# Patient Record
Sex: Female | Born: 1975
Health system: Southern US, Community
[De-identification: ages and names within clinical notes are randomized; demographics above are authoritative.]

## PROBLEM LIST (undated history)

## (undated) DIAGNOSIS — Z8719 Personal history of other diseases of the digestive system: Secondary | ICD-10-CM

## (undated) DIAGNOSIS — Z972 Presence of dental prosthetic device (complete) (partial): Secondary | ICD-10-CM

## (undated) DIAGNOSIS — D759 Disease of blood and blood-forming organs, unspecified: Secondary | ICD-10-CM

## (undated) DIAGNOSIS — Z8711 Personal history of peptic ulcer disease: Secondary | ICD-10-CM

## (undated) DIAGNOSIS — Z9289 Personal history of other medical treatment: Secondary | ICD-10-CM

## (undated) DIAGNOSIS — Z87442 Personal history of urinary calculi: Secondary | ICD-10-CM

## (undated) DIAGNOSIS — K219 Gastro-esophageal reflux disease without esophagitis: Secondary | ICD-10-CM

## (undated) DIAGNOSIS — D649 Anemia, unspecified: Secondary | ICD-10-CM

## (undated) DIAGNOSIS — N938 Other specified abnormal uterine and vaginal bleeding: Secondary | ICD-10-CM

## (undated) HISTORY — DX: Personal history of peptic ulcer disease: Z87.11

## (undated) HISTORY — PX: TUBAL LIGATION: SHX77

## (undated) HISTORY — DX: Personal history of other diseases of the digestive system: Z87.19

---

## 2015-03-06 ENCOUNTER — Emergency Department (HOSPITAL_COMMUNITY): Payer: Medicaid Other

## 2015-03-06 ENCOUNTER — Emergency Department (HOSPITAL_COMMUNITY)
Admission: EM | Admit: 2015-03-06 | Discharge: 2015-03-06 | Disposition: A | Discharge status: Home / Self Care | Payer: Medicaid Other | Attending: Physician Assistant | Admitting: Physician Assistant

## 2015-03-06 ENCOUNTER — Encounter (HOSPITAL_COMMUNITY): Payer: Self-pay | Admitting: Emergency Medicine

## 2015-03-06 DIAGNOSIS — Y9301 Activity, walking, marching and hiking: Secondary | ICD-10-CM | POA: Diagnosis not present

## 2015-03-06 DIAGNOSIS — S8992XA Unspecified injury of left lower leg, initial encounter: Secondary | ICD-10-CM | POA: Diagnosis present

## 2015-03-06 DIAGNOSIS — M25562 Pain in left knee: Secondary | ICD-10-CM

## 2015-03-06 DIAGNOSIS — Z23 Encounter for immunization: Secondary | ICD-10-CM | POA: Insufficient documentation

## 2015-03-06 DIAGNOSIS — S8002XA Contusion of left knee, initial encounter: Secondary | ICD-10-CM | POA: Diagnosis not present

## 2015-03-06 DIAGNOSIS — Y9289 Other specified places as the place of occurrence of the external cause: Secondary | ICD-10-CM | POA: Insufficient documentation

## 2015-03-06 DIAGNOSIS — Y998 Other external cause status: Secondary | ICD-10-CM | POA: Insufficient documentation

## 2015-03-06 DIAGNOSIS — F17210 Nicotine dependence, cigarettes, uncomplicated: Secondary | ICD-10-CM | POA: Diagnosis not present

## 2015-03-06 DIAGNOSIS — W01198A Fall on same level from slipping, tripping and stumbling with subsequent striking against other object, initial encounter: Secondary | ICD-10-CM | POA: Diagnosis not present

## 2015-03-06 DIAGNOSIS — S80212A Abrasion, left knee, initial encounter: Secondary | ICD-10-CM | POA: Insufficient documentation

## 2015-03-06 DIAGNOSIS — T148XXA Other injury of unspecified body region, initial encounter: Secondary | ICD-10-CM

## 2015-03-06 MED ORDER — IBUPROFEN 800 MG PO TABS: 800.0000 mg | ORAL_TABLET | Freq: Three times a day (TID) | ORAL | Status: DC

## 2015-03-06 MED ORDER — TETANUS-DIPHTH-ACELL PERTUSSIS 5-2.5-18.5 LF-MCG/0.5 IM SUSP
0.5000 mL | Freq: Once | INTRAMUSCULAR | Status: AC
  Administered 2015-03-06: 0.5 mL via INTRAMUSCULAR
  Filled 2015-03-06: qty 0.5

## 2015-03-06 MED ORDER — IBUPROFEN 400 MG PO TABS
800.0000 mg | ORAL_TABLET | Freq: Once | ORAL | Status: AC
  Administered 2015-03-06: 800 mg via ORAL
  Filled 2015-03-06: qty 2

## 2015-03-06 NOTE — ED Notes (Signed)
Pt reports that she had a fall 2 days ago where she tripped and fell striking her knee and elbow. Pt reports elbow is fine but knee feels like its getting worse. Pt alert x4.

## 2015-03-06 NOTE — ED Provider Notes (Signed)
CSN: [id number]     Arrival date & time 03/06/15  1038 History  By signing my name below, I, Emmanuella Mensah, attest that this documentation has been prepared under the direction and in the presence of  Cheri Fowler, PA-C. Electronically Signed: Angelene Giovanni, ED Scribe. 03/06/2015. 1:15 PM.    Chief Complaint  Patient presents with  . Knee Pain   The history is provided by the patient. No language interpreter was used.   HPI Comments: Sheila Bell is a 40 y.o. female who presents to the Emergency Department complaining of gradually worsening non-radiating moderate left knee pain  s/p fall that occurred 2 days ago. She reports associated swelling to the left knee. She explains that she was walking outside when she tripped and fell on her knees. Pt is unsure if her tetanus vaccine is UTD. She denies any numbness, tingling, or pain anywhere else.   History reviewed. No pertinent past medical history. Past Surgical History  Procedure Laterality Date  . Tubal ligation     No family history on file. Social History  Substance Use Topics  . Smoking status: Current Every Day Smoker -- 1.00 packs/day for 15 years    Types: Cigarettes  . Smokeless tobacco: None  . Alcohol Use: No   OB History    No data available     Review of Systems  All other systems reviewed and are negative.     Allergies  Review of patient's allergies indicates no known allergies.  Home Medications   Prior to Admission medications   Not on File   BP 112/56 mmHg  Pulse 86  Temp(Src) 98 F (36.7 C) (Oral)  Resp 18  SpO2 100%  LMP 02/20/2015 (Exact Date) Physical Exam  Constitutional: She is oriented to person, place, and time. She appears well-developed and well-nourished.  HENT:  Head: Normocephalic and atraumatic.  Right Ear: External ear normal.  Left Ear: External ear normal.  Eyes: Conjunctivae are normal. No scleral icterus.  Neck: No tracheal deviation present.  Cardiovascular:  Normal rate, regular rhythm, normal heart sounds and intact distal pulses.   Pulses:      Dorsalis pedis pulses are 2+ on the right side, and 2+ on the left side.  Capillary refill less than 3 seconds.   Pulmonary/Chest: Effort normal and breath sounds normal. No respiratory distress.  Abdominal: Soft. Bowel sounds are normal. She exhibits no distension. There is no tenderness.  Musculoskeletal: Normal range of motion. She exhibits tenderness.  No obvious deformity, ecchymosis.  Well appearing abrasion to left anterior knee.  No effusion or edema. TTP along patella, tibial plateau, and joint line.  No medial or lateral joint tenderness.  No quadricepts tendon tenderness.  Patella stable, normal patella mobility.  Compartment is soft and compressible.  Full ROM (flexion/extesion).   -Negative anterior/posterior drawer.   -Negative varus/valgus test.     Neurological: She is alert and oriented to person, place, and time.  Strength and sensation intact bilaterally throughout lower extremities.  Gait normal.   Skin: Skin is warm and dry.  Healing Abrasion on anterior knee without signs of infection.  Psychiatric: She has a normal mood and affect. Her behavior is normal.    ED Course  Procedures (including critical care time) DIAGNOSTIC STUDIES: Oxygen Saturation is 100% on RA, normal by my interpretation.    COORDINATION OF CARE: 1:04 PM- Pt advised of plan for treatment and pt agrees. Pt will receive 800 mg Ibuprofen for 3 times a day  and a knee sleeve. Advised to use ice each hour. Return precautions discussed, pt will return if she experiences numbness. WIll provide resources for follow up with Ortho.   Labs Review Labs Reviewed - No data to display  Imaging Review Dg Knee Complete 4 Views Left  03/06/2015  CLINICAL DATA:  Injury, fall 3 days ago on left knee. Unable to bear weight today. EXAM: LEFT KNEE - COMPLETE 4+ VIEW COMPARISON:  None. FINDINGS: There is no evidence of fracture,  dislocation, or joint effusion. There is no evidence of arthropathy or other focal bone abnormality. Soft tissues are unremarkable. IMPRESSION: Negative. Electronically Signed   By: Charlett Nose M.D.   On: 03/06/2015 11:48    Cheri Fowler, PA-C has personally reviewed and evaluated these images and lab results as part of her medical decision-making.   EKG Interpretation None      MDM   Final diagnoses:  Left knee pain  Knee contusion, left, initial encounter  Abrasion    VSS, NAD.  On exam, compartments soft and compressible. Generalized knee tenderness. Full active range of motion. Patient ambulatory without difficulty. Neurovascularly intact distal to injury. Abrasion noted on anterior aspect of knee without signs of infection. Patient given ibuprofen, and Tdap updated in ED. Plain films negative for acute abnormalities. Patient given knee sleeve. Follow-up with orthopedics for persistent symptoms. Discussed return precautions. Patient agrees an Field seismologist the above plan for discharge.  I personally performed the services described in this documentation, which was scribed in my presence. The recorded information has been reviewed and is accurate.   Cheri Fowler, PA-C 03/06/15 1317  Courteney Randall An, MD 03/06/15 1615

## 2015-03-06 NOTE — Discharge Instructions (Signed)

## 2015-03-06 NOTE — ED Notes (Signed)
Declined W/C at D/C and was escorted to lobby by RN. 

## 2015-03-06 NOTE — ED Notes (Signed)
Patient called in main ED waiting area with no response 

## 2017-07-05 DIAGNOSIS — Z9289 Personal history of other medical treatment: Secondary | ICD-10-CM

## 2017-07-05 HISTORY — DX: Personal history of other medical treatment: Z92.89

## 2017-07-21 ENCOUNTER — Inpatient Hospital Stay (HOSPITAL_COMMUNITY)
Admission: EM | Admit: 2017-07-21 | Discharge: 2017-07-23 | DRG: 812 | Disposition: A | Discharge status: Home / Self Care | Payer: Self-pay | Attending: Internal Medicine | Admitting: Internal Medicine

## 2017-07-21 ENCOUNTER — Emergency Department (HOSPITAL_COMMUNITY): Payer: Self-pay

## 2017-07-21 ENCOUNTER — Encounter (HOSPITAL_COMMUNITY): Payer: Self-pay

## 2017-07-21 DIAGNOSIS — N939 Abnormal uterine and vaginal bleeding, unspecified: Secondary | ICD-10-CM

## 2017-07-21 DIAGNOSIS — E349 Endocrine disorder, unspecified: Secondary | ICD-10-CM | POA: Diagnosis present

## 2017-07-21 DIAGNOSIS — N920 Excessive and frequent menstruation with regular cycle: Secondary | ICD-10-CM | POA: Diagnosis present

## 2017-07-21 DIAGNOSIS — R7989 Other specified abnormal findings of blood chemistry: Secondary | ICD-10-CM | POA: Diagnosis present

## 2017-07-21 DIAGNOSIS — N289 Disorder of kidney and ureter, unspecified: Secondary | ICD-10-CM

## 2017-07-21 DIAGNOSIS — B9620 Unspecified Escherichia coli [E. coli] as the cause of diseases classified elsewhere: Secondary | ICD-10-CM | POA: Diagnosis present

## 2017-07-21 DIAGNOSIS — E876 Hypokalemia: Secondary | ICD-10-CM | POA: Diagnosis present

## 2017-07-21 DIAGNOSIS — R1011 Right upper quadrant pain: Secondary | ICD-10-CM | POA: Diagnosis present

## 2017-07-21 DIAGNOSIS — Z6823 Body mass index (BMI) 23.0-23.9, adult: Secondary | ICD-10-CM

## 2017-07-21 DIAGNOSIS — D649 Anemia, unspecified: Secondary | ICD-10-CM | POA: Diagnosis present

## 2017-07-21 DIAGNOSIS — N390 Urinary tract infection, site not specified: Secondary | ICD-10-CM | POA: Diagnosis present

## 2017-07-21 DIAGNOSIS — I951 Orthostatic hypotension: Secondary | ICD-10-CM | POA: Diagnosis present

## 2017-07-21 DIAGNOSIS — F1410 Cocaine abuse, uncomplicated: Secondary | ICD-10-CM | POA: Diagnosis present

## 2017-07-21 DIAGNOSIS — Z9851 Tubal ligation status: Secondary | ICD-10-CM

## 2017-07-21 DIAGNOSIS — N179 Acute kidney failure, unspecified: Secondary | ICD-10-CM | POA: Diagnosis present

## 2017-07-21 DIAGNOSIS — E46 Unspecified protein-calorie malnutrition: Secondary | ICD-10-CM | POA: Diagnosis present

## 2017-07-21 DIAGNOSIS — I959 Hypotension, unspecified: Secondary | ICD-10-CM | POA: Diagnosis present

## 2017-07-21 DIAGNOSIS — E871 Hypo-osmolality and hyponatremia: Secondary | ICD-10-CM | POA: Diagnosis present

## 2017-07-21 DIAGNOSIS — R6510 Systemic inflammatory response syndrome (SIRS) of non-infectious origin without acute organ dysfunction: Secondary | ICD-10-CM | POA: Diagnosis present

## 2017-07-21 DIAGNOSIS — N760 Acute vaginitis: Secondary | ICD-10-CM | POA: Diagnosis present

## 2017-07-21 DIAGNOSIS — F17210 Nicotine dependence, cigarettes, uncomplicated: Secondary | ICD-10-CM | POA: Diagnosis present

## 2017-07-21 DIAGNOSIS — D500 Iron deficiency anemia secondary to blood loss (chronic): Principal | ICD-10-CM | POA: Diagnosis present

## 2017-07-21 HISTORY — DX: Anemia, unspecified: D64.9

## 2017-07-21 LAB — HCG, QUANTITATIVE, PREGNANCY: HCG, BETA CHAIN, QUANT, S: 2 m[IU]/mL (ref <5)

## 2017-07-21 LAB — HEPATIC FUNCTION PANEL
ALK PHOS: 129 U/L — AB (ref 38–126)
ALT: 35 U/L (ref 0–44)
AST: 46 U/L — ABNORMAL HIGH (ref 15–41)
Albumin: 2.4 g/dL — ABNORMAL LOW (ref 3.5–5.0)
BILIRUBIN INDIRECT: 0.5 mg/dL (ref 0.3–0.9)
Bilirubin, Direct: 0.2 mg/dL (ref 0.0–0.2)
TOTAL PROTEIN: 5.5 g/dL — AB (ref 6.5–8.1)
Total Bilirubin: 0.7 mg/dL (ref 0.3–1.2)

## 2017-07-21 LAB — BASIC METABOLIC PANEL
ANION GAP: 10 (ref 5–15)
BUN: 28 mg/dL — ABNORMAL HIGH (ref 6–20)
CALCIUM: 8.5 mg/dL — AB (ref 8.9–10.3)
CO2: 25 mmol/L (ref 22–32)
Chloride: 97 mmol/L — ABNORMAL LOW (ref 98–111)
Creatinine, Ser: 1.57 mg/dL — ABNORMAL HIGH (ref 0.44–1.00)
GFR, EST AFRICAN AMERICAN: 46 mL/min — AB (ref >60)
GFR, EST NON AFRICAN AMERICAN: 39 mL/min — AB (ref >60)
Glucose, Bld: 114 mg/dL — ABNORMAL HIGH (ref 70–99)
POTASSIUM: 2.7 mmol/L — AB (ref 3.5–5.1)
Sodium: 132 mmol/L — ABNORMAL LOW (ref 135–145)

## 2017-07-21 LAB — CBC
HCT: 22.9 % — ABNORMAL LOW (ref 36.0–46.0)
Hemoglobin: 6.9 g/dL — CL (ref 12.0–15.0)
MCH: 20.9 pg — ABNORMAL LOW (ref 26.0–34.0)
MCHC: 30.1 g/dL (ref 30.0–36.0)
MCV: 69.4 fL — ABNORMAL LOW (ref 78.0–100.0)
PLATELETS: 187 10*3/uL (ref 150–400)
RBC: 3.3 MIL/uL — AB (ref 3.87–5.11)
RDW: 17.2 % — AB (ref 11.5–15.5)
WBC: 11.2 10*3/uL — AB (ref 4.0–10.5)

## 2017-07-21 LAB — PREPARE RBC (CROSSMATCH)

## 2017-07-21 LAB — I-STAT BETA HCG BLOOD, ED (MC, WL, AP ONLY): I-stat hCG, quantitative: 23.6 m[IU]/mL — ABNORMAL HIGH (ref <5)

## 2017-07-21 LAB — RETICULOCYTES
RBC.: 3.05 MIL/uL — ABNORMAL LOW (ref 3.87–5.11)
Retic Ct Pct: <0.4 % — ABNORMAL LOW (ref 0.4–3.1)

## 2017-07-21 LAB — PROTIME-INR
INR: 1.19
Prothrombin Time: 15 seconds (ref 11.4–15.2)

## 2017-07-21 LAB — I-STAT TROPONIN, ED: Troponin i, poc: 0 ng/mL (ref 0.00–0.08)

## 2017-07-21 LAB — ABO/RH: ABO/RH(D): A POS

## 2017-07-21 MED ORDER — POTASSIUM CHLORIDE CRYS ER 20 MEQ PO TBCR
40.0000 meq | EXTENDED_RELEASE_TABLET | Freq: Once | ORAL | Status: AC
  Administered 2017-07-21: 40 meq via ORAL
  Filled 2017-07-21: qty 2

## 2017-07-21 MED ORDER — POTASSIUM CHLORIDE 10 MEQ/100ML IV SOLN
10.0000 meq | INTRAVENOUS | Status: AC
Start: 2017-07-21 — End: 2017-07-22
  Administered 2017-07-21 – 2017-07-22 (×3): 10 meq via INTRAVENOUS
  Filled 2017-07-21 (×3): qty 100

## 2017-07-21 MED ORDER — SODIUM CHLORIDE 0.9 % IV BOLUS
1000.0000 mL | Freq: Once | INTRAVENOUS | Status: AC
  Administered 2017-07-21: 1000 mL via INTRAVENOUS

## 2017-07-21 MED ORDER — SODIUM CHLORIDE 0.9 % IV SOLN
10.0000 mL/h | Freq: Once | INTRAVENOUS | Status: AC
  Administered 2017-07-22: 10 mL/h via INTRAVENOUS

## 2017-07-21 MED ORDER — SODIUM CHLORIDE 0.9 % IV BOLUS
1000.0000 mL | Freq: Once | INTRAVENOUS | Status: AC
  Administered 2017-07-22: 1000 mL via INTRAVENOUS

## 2017-07-21 NOTE — ED Provider Notes (Signed)
[facility] Provider Note   CSN: [id number] Arrival date & time: 07/21/17  1932     History   Chief Complaint Chief Complaint  Patient presents with  . Abnormal Lab    HPI Sheila Bell is a 42 y.o. female.  The history is provided by the patient.  She was sent here from urgent care center because of anemia.  She had presented there because of a 4-day history of nonproductive cough, headache, body aches.  She had not run a fever that she was aware of nor had any chills or sweats.  Urgent care center reported temperature 100.3 and found hemoglobin 7.5 and is of orthostatic vital signs.  Patient relates that for the last 6 weeks she has been having unusually heavy menses with bleeding for 7-8 days and then going off for 6-7 days.  There has been some mild right suprapubic cramping.  She denies nausea or vomiting.  She is a cigarette smoker or admitting 1 pack a day smoking but states that for the last 4 days she has only been smoking 1-2 cigarettes a day.  She does notice that she is weak and has some dizziness.  She denies any weight loss or change in appetite.  Past Medical History:  Diagnosis Date  . Anemia     There are no active problems to display for this patient.   Past Surgical History:  Procedure Laterality Date  . TUBAL LIGATION       OB History   None      Home Medications    Prior to Admission medications   Medication Sig Start Date End Date Taking? Authorizing Provider  naproxen sodium (ALEVE) 220 MG tablet Take 440 mg by mouth 2 (two) times daily as needed (pian, fever).   Yes [provider]  omeprazole (PRILOSEC) 20 MG capsule Take 20 mg by mouth daily as needed (acid reflux).   Yes [provider]  ibuprofen (ADVIL,MOTRIN) 800 MG tablet Take 1 tablet (800 mg total) by mouth 3 (three) times daily. Patient not taking: Reported on 07/21/2017 03/06/15   Cheri Fowlerose, Kayla, PA-C    Family History No  family history on file.  Social History Social History   Tobacco Use  . Smoking status: Current Every Day Smoker    Packs/day: 1.00    Years: 15.00    Pack years: 15.00    Types: Cigarettes  Substance Use Topics  . Alcohol use: No  . Drug use: No     Allergies   Patient has no known allergies.   Review of Systems Review of Systems  All other systems reviewed and are negative.    Physical Exam Updated Vital Signs BP (!) 96/52   Pulse (!) 102   Temp 99.4 F (37.4 C) (Oral)   Resp 20   LMP 07/14/2017   SpO2 100%   Physical Exam  Nursing note and vitals reviewed.  42 year old female, resting comfortably and in no acute distress. Vital signs are significant for elevated heart rate. Oxygen saturation is 100%, which is normal. Head is normocephalic and atraumatic. PERRLA, EOMI. Oropharynx is clear.  Conjunctivae are pale. Neck is nontender and supple without adenopathy or JVD. Back is nontender and there is no CVA tenderness. Lungs are clear without rales, wheezes, or rhonchi. Chest is nontender. Heart has regular rate and rhythm without murmur. Abdomen is soft, flat, nontender without masses or hepatosplenomegaly and peristalsis is normoactive. Pelvic: Normal external female genitalia.  Small amount of blood present in the vaginal vault.  Cervix is closed.  Fundus appears to have normal size and position.  No adnexal masses or tenderness. Extremities have no cyanosis or edema, full range of motion is present. Skin is warm and dry without rash. Neurologic: Mental status is normal, cranial nerves are intact, there are no motor or sensory deficits.  ED Treatments / Results  Labs (all labs ordered are listed, but only abnormal results are displayed) Labs Reviewed  CBC - Abnormal; Notable for the following components:      Result Value   WBC 11.2 (*)    RBC 3.30 (*)    Hemoglobin 6.9 (*)    HCT 22.9 (*)    MCV 69.4 (*)    MCH 20.9 (*)    RDW 17.2 (*)    All  other components within normal limits  BASIC METABOLIC PANEL - Abnormal; Notable for the following components:   Sodium 132 (*)    Potassium 2.7 (*)    Chloride 97 (*)    Glucose, Bld 114 (*)    BUN 28 (*)    Creatinine, Ser 1.57 (*)    Calcium 8.5 (*)    GFR calc non Af Amer 39 (*)    GFR calc Af Amer 46 (*)    All other components within normal limits  HEPATIC FUNCTION PANEL - Abnormal; Notable for the following components:   Total Protein 5.5 (*)    Albumin 2.4 (*)    AST 46 (*)    Alkaline Phosphatase 129 (*)    All other components within normal limits  I-STAT BETA HCG BLOOD, ED ([facility], [facility], [facility] ONLY) - Abnormal; Notable for the following components:   I-stat hCG, quantitative 23.6 (*)    All other components within normal limits  PROTIME-INR  I-STAT TROPONIN, ED  TYPE AND SCREEN  ABO/RH    EKG EKG Interpretation  Date/Time:  Wednesday July 21 2017 22:01:24 EDT Ventricular Rate:  103 PR Interval:    QRS Duration: 94 QT Interval:  317 QTC Calculation: 415 R Axis:   87 Text Interpretation:  Sinus tachycardia Probable left atrial enlargement No previous ECGs available Confirmed by Vanetta Mulders [phone]) on 07/21/2017 10:45:52 PM   Radiology No results found.  Procedures Procedures  CRITICAL CARE Performed by: Dione Booze Total critical care time: 45 minutes Critical care time was exclusive of separately billable procedures and treating other patients. Critical care was necessary to treat or prevent imminent or life-threatening deterioration. Critical care was time spent personally by me on the following activities: development of treatment plan with patient and/or surrogate as well as nursing, discussions with consultants, evaluation of patient's response to treatment, examination of patient, obtaining history from patient or surrogate, ordering and performing treatments and interventions, ordering and review of laboratory studies, ordering and review of radiographic  studies, pulse oximetry and re-evaluation of patient's condition.  Medications Ordered in ED Medications  potassium chloride 10 mEq in 100 mL IVPB (0 mEq Intravenous Stopped 07/22/17 0146)  sodium chloride flush (NS) 0.9 % injection 3 mL (3 mLs Intravenous Given 07/22/17 0150)  0.9 %  sodium chloride infusion (has no administration in time range)  acetaminophen (TYLENOL) tablet 650 mg (has no administration in time range)    Or  acetaminophen (TYLENOL) suppository 650 mg (has no administration in time range)  senna-docusate (Senokot-S) tablet 1 tablet (has no administration in time range)  ondansetron (ZOFRAN) tablet 4 mg (has no administration in time range)  Or  ondansetron (ZOFRAN) injection 4 mg (has no administration in time range)  magnesium sulfate IVPB 2 g 50 mL (has no administration in time range)  ondansetron (ZOFRAN) injection 4 mg (has no administration in time range)  sodium chloride 0.9 % bolus 1,000 mL (0 mLs Intravenous Stopped 07/22/17 0012)  potassium chloride SA (K-DUR,KLOR-CON) CR tablet 40 mEq (40 mEq Oral Given 07/21/17 2320)  0.9 %  sodium chloride infusion (10 mL/hr Intravenous New Bag/Given 07/22/17 0009)  sodium chloride 0.9 % bolus 1,000 mL (0 mLs Intravenous Stopped 07/22/17 0121)     Initial Impression / Assessment and Plan / ED Course  I have reviewed the triage vital signs and the nursing notes.  Pertinent labs & imaging results that were available during my care of the patient were reviewed by me and considered in my medical decision making (see chart for details).  Anemia which is likely secondary to menstrual blood loss.  RBC indices are noted to be significantly microcytic hypochromic consistent with iron deficiency anemia.  However, labs also show severe hypokalemia with potassium 2.7 and mild renal insufficiency with creatinine 1.57.  Also, hypoalbuminemia with albumin 2.4.  No prior labs available for comparison.  She will need to have a pelvic exam,  will check chest x-ray.  I am concerned about possible occult malignancy.  Because she is symptomatic, will give transfusion.  With multiple problems active, will admit for further evaluation and treatment.  Case is discussed with Dr. Antionette Char, of [facility], who agrees to admit the patient.  Pelvic exam is unremarkable.  Pelvic ultrasound had been obtained with results pending.  Final Clinical Impressions(s) / ED Diagnoses   Final diagnoses:  Renal insufficiency  Symptomatic anemia  Abnormal uterine bleeding  Hypokalemia    ED Discharge Orders    None       Dione Booze, MD 07/22/17 [phone]

## 2017-07-21 NOTE — ED Provider Notes (Signed)
Patient placed in Quick Look pathway, seen and evaluated   Chief Complaint: referral from UC for low hgb  HPI:   Prolonged menstrual cycles in the last 6 weeks, 8 days on/7 days off twice in the last month.   ROS: positive: low grade fever, HA, fatigue, shortness of breath, body aches x 4.5 days Negative: CP, cough, abdominal pain  Physical Exam:   Gen: No distress  Neuro: Awake and Alert  Skin: Warm    Focused Exam: looks and sounds tired, pale. Tachycardic, borderline hypotensive. Will request pt gets room next.    Initiation of care has begun. The patient has been counseled on the process, plan, and necessity for staying for the completion/evaluation, and the remainder of the medical screening examination    Sheila Bell, Sheila Burbage J, PA-C 07/21/17 2023    Dione BoozeGlick, David, MD 07/21/17 2252

## 2017-07-21 NOTE — ED Triage Notes (Signed)
Pt sent from UC has been having fevers since last night, headache for four days, SOB, fatigue and has had abnormal periods for the past six week, lasting long for 8 days, reports hbg of 7.5

## 2017-07-21 NOTE — ED Notes (Signed)
Critical hgb received.  Will prioritize rooming

## 2017-07-21 NOTE — ED Notes (Signed)
[facility]  States recent hx of headache, fever.  [facility] 100.3, HR 126.  CBC showed Hgb of 7.5 and positive orthostatic changes

## 2017-07-22 ENCOUNTER — Inpatient Hospital Stay (HOSPITAL_COMMUNITY): Payer: Self-pay

## 2017-07-22 ENCOUNTER — Encounter (HOSPITAL_COMMUNITY): Payer: Self-pay | Admitting: Family Medicine

## 2017-07-22 ENCOUNTER — Other Ambulatory Visit: Payer: Self-pay

## 2017-07-22 DIAGNOSIS — R6510 Systemic inflammatory response syndrome (SIRS) of non-infectious origin without acute organ dysfunction: Secondary | ICD-10-CM

## 2017-07-22 DIAGNOSIS — E876 Hypokalemia: Secondary | ICD-10-CM

## 2017-07-22 DIAGNOSIS — N289 Disorder of kidney and ureter, unspecified: Secondary | ICD-10-CM

## 2017-07-22 DIAGNOSIS — E349 Endocrine disorder, unspecified: Secondary | ICD-10-CM

## 2017-07-22 DIAGNOSIS — R1011 Right upper quadrant pain: Secondary | ICD-10-CM | POA: Diagnosis present

## 2017-07-22 DIAGNOSIS — N921 Excessive and frequent menstruation with irregular cycle: Secondary | ICD-10-CM

## 2017-07-22 LAB — COMPREHENSIVE METABOLIC PANEL
ALBUMIN: 2.1 g/dL — AB (ref 3.5–5.0)
ALK PHOS: 181 U/L — AB (ref 38–126)
ALT: 56 U/L — AB (ref 0–44)
ANION GAP: 9 (ref 5–15)
AST: 84 U/L — ABNORMAL HIGH (ref 15–41)
BUN: 23 mg/dL — AB (ref 6–20)
CALCIUM: 7.7 mg/dL — AB (ref 8.9–10.3)
CO2: 20 mmol/L — AB (ref 22–32)
Chloride: 108 mmol/L (ref 98–111)
Creatinine, Ser: 1.36 mg/dL — ABNORMAL HIGH (ref 0.44–1.00)
GFR calc Af Amer: 54 mL/min — ABNORMAL LOW (ref >60)
GFR calc non Af Amer: 47 mL/min — ABNORMAL LOW (ref >60)
GLUCOSE: 104 mg/dL — AB (ref 70–99)
Potassium: 3.8 mmol/L (ref 3.5–5.1)
SODIUM: 137 mmol/L (ref 135–145)
Total Bilirubin: 2.3 mg/dL — ABNORMAL HIGH (ref 0.3–1.2)
Total Protein: 4.8 g/dL — ABNORMAL LOW (ref 6.5–8.1)

## 2017-07-22 LAB — URINALYSIS, ROUTINE W REFLEX MICROSCOPIC
BILIRUBIN URINE: NEGATIVE
Glucose, UA: NEGATIVE mg/dL
Ketones, ur: NEGATIVE mg/dL
NITRITE: NEGATIVE
PH: 6 (ref 5.0–8.0)
Protein, ur: NEGATIVE mg/dL
SPECIFIC GRAVITY, URINE: 1.004 — AB (ref 1.005–1.030)

## 2017-07-22 LAB — HEMOGLOBIN AND HEMATOCRIT, BLOOD
HCT: 29.8 % — ABNORMAL LOW (ref 36.0–46.0)
Hemoglobin: 9.3 g/dL — ABNORMAL LOW (ref 12.0–15.0)

## 2017-07-22 LAB — RAPID URINE DRUG SCREEN, HOSP PERFORMED
Amphetamines: POSITIVE — AB
BENZODIAZEPINES: NOT DETECTED
Cocaine: POSITIVE — AB
Opiates: NOT DETECTED
TETRAHYDROCANNABINOL: NOT DETECTED

## 2017-07-22 LAB — RPR: RPR: NONREACTIVE

## 2017-07-22 LAB — IRON AND TIBC
Iron: <5 ug/dL — ABNORMAL LOW (ref 28–170)
TIBC: 256 ug/dL (ref 250–450)

## 2017-07-22 LAB — WET PREP, GENITAL
SPERM: NONE SEEN
TRICH WET PREP: NONE SEEN
Yeast Wet Prep HPF POC: NONE SEEN

## 2017-07-22 LAB — MAGNESIUM: Magnesium: 2.2 mg/dL (ref 1.7–2.4)

## 2017-07-22 LAB — FERRITIN: Ferritin: 243 ng/mL (ref 11–307)

## 2017-07-22 LAB — GC/CHLAMYDIA PROBE AMP (~~LOC~~) NOT AT ARMC
Chlamydia: NEGATIVE
NEISSERIA GONORRHEA: NEGATIVE

## 2017-07-22 LAB — FOLATE: FOLATE: 8.9 ng/mL (ref >5.9)

## 2017-07-22 LAB — PREGNANCY, URINE: Preg Test, Ur: NEGATIVE

## 2017-07-22 LAB — LACTIC ACID, PLASMA: Lactic Acid, Venous: 1.8 mmol/L (ref 0.5–1.9)

## 2017-07-22 LAB — HIV ANTIBODY (ROUTINE TESTING W REFLEX): HIV Screen 4th Generation wRfx: NONREACTIVE

## 2017-07-22 LAB — MRSA PCR SCREENING: MRSA BY PCR: NEGATIVE

## 2017-07-22 LAB — VITAMIN B12: Vitamin B-12: 613 pg/mL (ref 180–914)

## 2017-07-22 LAB — PROCALCITONIN: Procalcitonin: 4.72 ng/mL

## 2017-07-22 MED ORDER — SODIUM CHLORIDE 0.9% FLUSH
3.0000 mL | Freq: Two times a day (BID) | INTRAVENOUS | Status: DC
  Administered 2017-07-22 – 2017-07-23 (×4): 3 mL via INTRAVENOUS

## 2017-07-22 MED ORDER — MORPHINE SULFATE (PF) 2 MG/ML IV SOLN: 2.0000 mg | INTRAVENOUS | Status: DC | PRN

## 2017-07-22 MED ORDER — FAMOTIDINE IN NACL 20-0.9 MG/50ML-% IV SOLN
20.0000 mg | Freq: Once | INTRAVENOUS | Status: AC
  Administered 2017-07-22: 20 mg via INTRAVENOUS
  Filled 2017-07-22: qty 50

## 2017-07-22 MED ORDER — ALBUTEROL SULFATE (2.5 MG/3ML) 0.083% IN NEBU
2.5000 mg | INHALATION_SOLUTION | RESPIRATORY_TRACT | Status: DC | PRN
  Administered 2017-07-22 – 2017-07-23 (×2): 2.5 mg via RESPIRATORY_TRACT
  Filled 2017-07-22 (×2): qty 3

## 2017-07-22 MED ORDER — SODIUM CHLORIDE 0.9 % IV SOLN
INTRAVENOUS | Status: AC
  Administered 2017-07-22: 03:00:00 via INTRAVENOUS

## 2017-07-22 MED ORDER — SODIUM CHLORIDE 0.9 % IV SOLN
1.0000 g | INTRAVENOUS | Status: DC
  Administered 2017-07-22 – 2017-07-23 (×2): 1 g via INTRAVENOUS
  Filled 2017-07-22 (×3): qty 10

## 2017-07-22 MED ORDER — ONDANSETRON HCL 4 MG/2ML IJ SOLN: 4.0000 mg | Freq: Once | INTRAMUSCULAR | Status: DC

## 2017-07-22 MED ORDER — ACETAMINOPHEN 325 MG PO TABS
650.0000 mg | ORAL_TABLET | Freq: Four times a day (QID) | ORAL | Status: DC | PRN
  Administered 2017-07-22 (×2): 650 mg via ORAL
  Filled 2017-07-22 (×2): qty 2

## 2017-07-22 MED ORDER — SENNOSIDES-DOCUSATE SODIUM 8.6-50 MG PO TABS: 1.0000 | ORAL_TABLET | Freq: Every evening | ORAL | Status: DC | PRN

## 2017-07-22 MED ORDER — METRONIDAZOLE 500 MG PO TABS
500.0000 mg | ORAL_TABLET | Freq: Two times a day (BID) | ORAL | Status: DC
  Administered 2017-07-22 – 2017-07-23 (×2): 500 mg via ORAL
  Filled 2017-07-22 (×2): qty 1

## 2017-07-22 MED ORDER — SODIUM CHLORIDE 0.9 % IV SOLN
INTRAVENOUS | Status: DC
  Administered 2017-07-22: 18:00:00 via INTRAVENOUS

## 2017-07-22 MED ORDER — ONDANSETRON HCL 4 MG PO TABS
4.0000 mg | ORAL_TABLET | Freq: Four times a day (QID) | ORAL | Status: DC | PRN
  Administered 2017-07-23: 4 mg via ORAL
  Filled 2017-07-22: qty 1

## 2017-07-22 MED ORDER — ACETAMINOPHEN 650 MG RE SUPP: 650.0000 mg | Freq: Four times a day (QID) | RECTAL | Status: DC | PRN

## 2017-07-22 MED ORDER — ONDANSETRON HCL 4 MG/2ML IJ SOLN
4.0000 mg | Freq: Four times a day (QID) | INTRAMUSCULAR | Status: DC | PRN
  Administered 2017-07-22: 4 mg via INTRAVENOUS
  Filled 2017-07-22: qty 2

## 2017-07-22 MED ORDER — MAGNESIUM SULFATE 2 GM/50ML IV SOLN
2.0000 g | Freq: Once | INTRAVENOUS | Status: AC
  Administered 2017-07-22: 2 g via INTRAVENOUS
  Filled 2017-07-22: qty 50

## 2017-07-22 NOTE — H&P (Signed)
History and Physical    Sheila Bell KCL:[id number] DOB: [birth year] DOA: 07/21/2017  PCP: Patient, No Pcp Per   Patient coming from: Home   Chief Complaint: Malaise, diffuse aches, non-productive cough, abnormally long and heavy menstrual periods, lightheadedness on standing  HPI: Sheila Bell is a 42 y.o. female with medical history significant for tobacco abuse, now presenting to the emergency department for evaluation of malaise, diffuse aches, mild nonproductive cough, lightheadedness on standing, generalized weakness, and abnormally heavy and long menstrual periods for the past 6 weeks.  Patient reports heavy menstrual bleeding for the past 6 weeks and 1 week of general malaise, generalized weakness, lightheadedness on standing, mild nonproductive cough, and diffuse aches.  She was evaluated at an urgent care for these complaints, noted to be orthostatic with low-grade temp, and she was directed to the ED for further evaluation.  ED Course: Upon arrival to the ED, patient is found to be afebrile, saturating well on room air, slightly tachypneic, slightly tachycardic, and with blood pressure 92/51.  EKG features sinus tachycardia with rate 103 and chest x-ray is negative for acute cardiopulmonary disease.  Chemistry panel is notable for sodium of 132, potassium 2.7, creatinine 1.57, and slight elevations in alkaline phosphatase and AST.  hCG was mildly elevated.  CBC is notable for a leukocytosis to 11,200 and microcytic anemia with hemoglobin of 6.9.  Patient was given a liter of normal saline, 30 mEq IV potassium, 40 mEq oral potassium, and 2 units of packed red blood cells were ordered for immediate transfusion.  Anemia panel was sent from the ED.  ED provider will perform pelvic exam with wet prep and patient will be admitted for ongoing evaluation and management.  Review of Systems:  All other systems reviewed and apart from HPI, are negative.  Past Medical History:  Diagnosis  Date  . Anemia     Past Surgical History:  Procedure Laterality Date  . TUBAL LIGATION       reports that she has been smoking cigarettes.  She has a 15.00 pack-year smoking history. She has never used smokeless tobacco. She reports that she does not drink alcohol or use drugs.  No Known Allergies  History reviewed. No pertinent family history.   Prior to Admission medications   Medication Sig Start Date End Date Taking? Authorizing Provider  naproxen sodium (ALEVE) 220 MG tablet Take 440 mg by mouth 2 (two) times daily as needed (pian, fever).   Yes [provider]  omeprazole (PRILOSEC) 20 MG capsule Take 20 mg by mouth daily as needed (acid reflux).   Yes [provider]  ibuprofen (ADVIL,MOTRIN) 800 MG tablet Take 1 tablet (800 mg total) by mouth 3 (three) times daily. Patient not taking: Reported on 07/21/2017 03/06/15   Gloriann Loan, PA-C    Physical Exam: Vitals:   07/21/17 2300 07/21/17 2330 07/22/17 0006 07/22/17 0021  BP: (!) 95/48 (!) 95/56 (!) 89/51 (!) 97/56  Pulse: (!) 108  (!) 112 (!) 114  Resp: (!) _0 (!) 23  Temp:   99.2 F (37.3 C) 99.3 F (37.4 C)  TempSrc:   Oral Oral  SpO2: 100%   100%      Constitutional: No respiratory distress, appears uncomfortable, cachectic  Eyes: PERTLA, lids and conjunctivae normal ENMT: Mucous membranes are moist. Posterior pharynx clear of any exudate or lesions.   Neck: normal, supple, no masses, no thyromegaly Respiratory: clear to auscultation bilaterally, no wheezing, no crackles. Normal respiratory effort.  Cardiovascular: Rate ~110 and regular. No extremity edema.  Abdomen: No distension, soft, tender in RUQ without rebound pain or guarding. Bowel sounds normal.  Musculoskeletal: no clubbing / cyanosis. No joint deformity upper and lower extremities.   Skin: no significant rashes, lesions, ulcers. Warm, dry, well-perfused. Neurologic: CN 2-12 grossly intact. Sensation intact. Strength 5/5 in all  4 limbs.  Psychiatric: Alert and oriented to person, place, and situation. Calm, cooperative.     Labs on Admission: I have personally reviewed following labs and imaging studies  CBC: Recent Labs  Lab 07/21/17 2040  WBC 11.2*  HGB 6.9*  HCT 22.9*  MCV 69.4*  PLT 656   Basic Metabolic Panel: Recent Labs  Lab 07/21/17 2040  NA 132*  K 2.7*  CL 97*  CO2 25  GLUCOSE 114*  BUN 28*  CREATININE 1.57*  CALCIUM 8.5*   GFR: CrCl cannot be calculated (Unknown ideal weight.). Liver Function Tests: Recent Labs  Lab 07/21/17 2040  AST 46*  ALT 35  ALKPHOS 129*  BILITOT 0.7  PROT 5.5*  ALBUMIN 2.4*   No results for input(s): LIPASE, AMYLASE in the last 168 hours. No results for input(s): AMMONIA in the last 168 hours. Coagulation Profile: Recent Labs  Lab 07/21/17 2040  INR 1.19   Cardiac Enzymes: No results for input(s): CKTOTAL, CKMB, CKMBINDEX, TROPONINI in the last 168 hours. BNP (last 3 results) No results for input(s): PROBNP in the last 8760 hours. HbA1C: No results for input(s): HGBA1C in the last 72 hours. CBG: No results for input(s): GLUCAP in the last 168 hours. Lipid Profile: No results for input(s): CHOL, HDL, LDLCALC, TRIG, CHOLHDL, LDLDIRECT in the last 72 hours. Thyroid Function Tests: No results for input(s): TSH, T4TOTAL, FREET4, T3FREE, THYROIDAB in the last 72 hours. Anemia Panel: Recent Labs    07/21/17 2314  RETICCTPCT <0.4*   Urine analysis: No results found for: COLORURINE, APPEARANCEUR, LABSPEC, PHURINE, GLUCOSEU, HGBUR, BILIRUBINUR, KETONESUR, PROTEINUR, UROBILINOGEN, NITRITE, LEUKOCYTESUR Sepsis Labs: _0 (procalcitonin:4,lacticidven:4) )No results found for this or any previous visit (from the past 240 hour(s)).   Radiological Exams on Admission: Dg Chest 2 View  Result Date: 07/21/2017 CLINICAL DATA:  42 year old female with cough. EXAM: CHEST - 2 VIEW COMPARISON:  None. FINDINGS: The lungs are clear. There is no  pleural effusion or pneumothorax. The cardiac silhouette is within normal limits. Bilateral nipple shadows noted. No acute osseous pathology. IMPRESSION: No active cardiopulmonary disease. Electronically Signed   By: Anner Crete M.D.   On: 07/21/2017 23:39    EKG: Independently reviewed. Sinus tachycardia (rate 103).   Assessment/Plan  1. Symptomatic anemia  - Presents with several days of malaise, diffuse aches, reported to have low-grade temp and orthostasis at urgent care and directed to ED  - She reports abnormal menstrual bleeding for the past 6 weeks with heavy bleeding most days  - Hgb is 6.9 on admission with no priors for comparison  - ED physician is performing pelvic exam   - Anemia panel was ordered and 2 units pRBC are transfusing  - Check post-transfusion CBC    2. SIRS  - Presents with mild non-productive cough, malaise, diffuse aches  - There is a leukocytosis, mild tachycardia, and SBP in low 90's in ED  - CXR clear, UA still pending  - Check lactate, collect blood and urine cultures, check RUQ [place] given tenderness, check respiratory virus panel  - ED physician is performing pelvic exam with wet prep  - Give additional NS bolus to  complete 30 cc/kg    3. Hypokalemia  - Serum potassium is 2.7 on admission  - Treated in ED with 40 mEq oral and 30 mEq IV potassium  - Give empiric mag, continue cardiac monitoring, repeat chem panel in am    4. Mild renal insufficiency  - SCr is 1.57 on admission with no prior labs available  - Likely acute and prerenal in setting of acute illness with hypotension  - Given 1 liter NS in ED, a second ordered on admission, and 2 units RBCs will be transfused  - Renally-dose medications, avoid nephrotoxins, continue fluid-resuscitation, and repeat chem panel in am    5. RUQ pain  - Patient complains of RUQ pain and there is tenderness on exam without peritoneal signs  - Slight alk phos and AST elevations noted with normal bili  -  Check [place], repeat CMP in am, continue supportive care   6. Elevated hCG - Serum hCG mildly elevated  - Patient reports hx of tubal ligation  - Abd [place] is ordered as above    DVT prophylaxis: SCD's  Code Status: Full  Family Communication: Discussed with patient  Consults called: None Admission status: Inpatient     Vianne Bulls, MD [facility] Pager [phone]  If 7PM-7AM, please contact night-coverage [email or web address] Password [facility]  07/22/2017, 12:51 AM

## 2017-07-22 NOTE — Progress Notes (Signed)
Patient stated she feels SOB and she has exp wheezing. Breathing tx given, see EMAR. Patient says her stomach feels better this morning. Will continue to monitor.

## 2017-07-22 NOTE — ED Notes (Signed)
Patient transported to Ultrasound 

## 2017-07-22 NOTE — Progress Notes (Signed)
BP is 87/60 MAP is 64 HR is 72 post blood transfusion. Dr Craige CottaKirby notified by text page. Will continue to monitor.

## 2017-07-22 NOTE — Progress Notes (Addendum)
Sheila SitesJennifer E Bell is a 42 y.o. female with medical history significant for tobacco abuse, now presenting to the emergency department for evaluation of malaise, diffuse aches, mild nonproductive cough, lightheadedness on standing, generalized weakness, and abnormally heavy and long menstrual periods for the past 6 weeks.  Hemoglobin on presentation 6.9.  Post 2 units of PRBCs transfusion.  Repeat hemoglobin 9.3.  07/22/17: Acute transaminitis and elevated total bilirubin.  Abdominal ultrasound remarkable for gallbladder wall thickening and trace pericholecystic perihepatic ascites with no gallstone or biliary ductal dilatation.  Negative sonographic Murphy sign.  Seen and examined at her bedside.  She denies abdominal pain or nausea.  Reports that she aches all over.  Self-reported history of cocaine abuse.  Last use about a month ago.  UDS ordered and pending.  Clue cells on wet prep- Bacterial vaginosis-started on po flagyl 500 mg BID x 7 days.  UTI, poa- started Rocephin empirically-urine culture in process.  Please refer to H&P dictated by Dr. Antionette Charpyd on 07/22/2017 for further details of the assessment and plan.

## 2017-07-22 NOTE — ED Notes (Signed)
Pt back in room from US. Glick at bedside to perform pelvic.

## 2017-07-22 NOTE — ED Notes (Signed)
Pt to get pelvic down in ED before pt is moved to floor per MD Preston FleetingGlick

## 2017-07-23 DIAGNOSIS — D649 Anemia, unspecified: Secondary | ICD-10-CM

## 2017-07-23 LAB — TYPE AND SCREEN
ABO/RH(D): A POS
Antibody Screen: NEGATIVE
Unit division: 0
Unit division: 0

## 2017-07-23 LAB — BASIC METABOLIC PANEL
Anion gap: 7 (ref 5–15)
BUN: 19 mg/dL (ref 6–20)
CHLORIDE: 109 mmol/L (ref 98–111)
CO2: 22 mmol/L (ref 22–32)
Calcium: 7.9 mg/dL — ABNORMAL LOW (ref 8.9–10.3)
Creatinine, Ser: 1.19 mg/dL — ABNORMAL HIGH (ref 0.44–1.00)
GFR calc Af Amer: >60 mL/min (ref >60)
GFR calc non Af Amer: 55 mL/min — ABNORMAL LOW (ref >60)
Glucose, Bld: 97 mg/dL (ref 70–99)
POTASSIUM: 3.4 mmol/L — AB (ref 3.5–5.1)
SODIUM: 138 mmol/L (ref 135–145)

## 2017-07-23 LAB — HEPATIC FUNCTION PANEL
ALBUMIN: 1.8 g/dL — AB (ref 3.5–5.0)
ALT: 70 U/L — AB (ref 0–44)
AST: 91 U/L — ABNORMAL HIGH (ref 15–41)
Alkaline Phosphatase: 221 U/L — ABNORMAL HIGH (ref 38–126)
Bilirubin, Direct: 0.8 mg/dL — ABNORMAL HIGH (ref 0.0–0.2)
Indirect Bilirubin: 0.5 mg/dL (ref 0.3–0.9)
TOTAL PROTEIN: 4.8 g/dL — AB (ref 6.5–8.1)
Total Bilirubin: 1.3 mg/dL — ABNORMAL HIGH (ref 0.3–1.2)

## 2017-07-23 LAB — BPAM RBC
BLOOD PRODUCT EXPIRATION DATE: 201908062359
Blood Product Expiration Date: 201908062359
ISSUE DATE / TIME: 201907172355
ISSUE DATE / TIME: 201907180325
UNIT TYPE AND RH: 6200
Unit Type and Rh: 6200

## 2017-07-23 LAB — CBC
HCT: 28.6 % — ABNORMAL LOW (ref 36.0–46.0)
HEMOGLOBIN: 8.9 g/dL — AB (ref 12.0–15.0)
MCH: 22.7 pg — AB (ref 26.0–34.0)
MCHC: 31.1 g/dL (ref 30.0–36.0)
MCV: 73 fL — ABNORMAL LOW (ref 78.0–100.0)
Platelets: 171 10*3/uL (ref 150–400)
RBC: 3.92 MIL/uL (ref 3.87–5.11)
RDW: 20 % — ABNORMAL HIGH (ref 11.5–15.5)
WBC: 7.4 10*3/uL (ref 4.0–10.5)

## 2017-07-23 MED ORDER — NAPROXEN SODIUM 220 MG PO TABS: 440.0000 mg | ORAL_TABLET | Freq: Two times a day (BID) | ORAL | 0 refills | Status: DC | PRN

## 2017-07-23 MED ORDER — POTASSIUM CHLORIDE CRYS ER 20 MEQ PO TBCR
40.0000 meq | EXTENDED_RELEASE_TABLET | Freq: Once | ORAL | Status: AC
  Administered 2017-07-23: 40 meq via ORAL
  Filled 2017-07-23: qty 2

## 2017-07-23 MED ORDER — METRONIDAZOLE 500 MG PO TABS: 500.0000 mg | ORAL_TABLET | Freq: Two times a day (BID) | ORAL | 0 refills | Status: AC

## 2017-07-23 MED ORDER — CEPHALEXIN 500 MG PO CAPS: 500.0000 mg | ORAL_CAPSULE | Freq: Three times a day (TID) | ORAL | 0 refills | Status: AC

## 2017-07-23 MED ORDER — ALUM & MAG HYDROXIDE-SIMETH 200-200-20 MG/5ML PO SUSP: 30.0000 mL | ORAL | Status: DC | PRN

## 2017-07-23 MED ORDER — OMEPRAZOLE 20 MG PO CPDR: 20.0000 mg | DELAYED_RELEASE_CAPSULE | Freq: Every day | ORAL | 0 refills | Status: DC | PRN

## 2017-07-23 MED ORDER — FERROUS SULFATE 325 (65 FE) MG PO TBEC: 325.0000 mg | DELAYED_RELEASE_TABLET | Freq: Two times a day (BID) | ORAL | 11 refills | Status: DC

## 2017-07-23 NOTE — Progress Notes (Addendum)
Patient with d/c orders pending HFP lab; first draw insufficient quantity, MD made aware. Lab to redraw; patient updated on status. 1630  1840 Labs resulted; called lab to confirm results as they resulted at 0255, lab states they added on HFP from AM bmet, MD paged to make aware. MD and RN spoke with patient in room on speaker phone.  Patient to d/c.

## 2017-07-23 NOTE — Discharge Instructions (Signed)
Follow with Primary MD and the recommended OB physician in 7 days   Get CBC, CMP, iron panel checked  by Primary MD in 5-7 days   Activity: As tolerated with Full fall precautions use walker/cane & assistance as needed  Disposition Home   Diet:   Heart Healthy   For Heart failure patients - Check your Weight same time everyday, if you gain over 2 pounds, or you develop in leg swelling, experience more shortness of breath or chest pain, call your Primary MD immediately. Follow Cardiac Low Salt Diet and 1.5 lit/day fluid restriction.  Special Instructions: If you have smoked or chewed Tobacco  in the last 2 yrs please stop smoking, stop any regular Alcohol  and or any Recreational drug use.  On your next visit with your primary care physician please Get Medicines reviewed and adjusted.  Please request your Prim.MD to go over all Hospital Tests and Procedure/Radiological results at the follow up, please get all Hospital records sent to your Prim MD by signing hospital release before you go home.  If you experience worsening of your admission symptoms, develop shortness of breath, life threatening emergency, suicidal or homicidal thoughts you must seek medical attention immediately by calling 911 or calling your MD immediately  if symptoms less severe.  You Must read complete instructions/literature along with all the possible adverse reactions/side effects for all the Medicines you take and that have been prescribed to you. Take any new Medicines after you have completely understood and accpet all the possible adverse reactions/side effects.   Do not drive, operate heavy machinery, perform activities at heights, swimming or participation in water activities or provide baby sitting services if your were admitted for syncope or siezures until you have seen by Primary MD or a Neurologist and advised to do so again.  Do not drive when taking Pain medications.    Do not take more than prescribed  Pain, Sleep and Anxiety Medications  Wear Seat belts while driving.   Please note  You were cared for by a hospitalist during your hospital stay. If you have any questions about your discharge medications or the care you received while you were in the hospital after you are discharged, you can call the unit and asked to speak with the hospitalist on call if the hospitalist that took care of you is not available. Once you are discharged, your primary care physician will handle any further medical issues. Please note that NO REFILLS for any discharge medications will be authorized once you are discharged, as it is imperative that you return to your primary care physician (or establish a relationship with a primary care physician if you do not have one) for your aftercare needs so that they can reassess your need for medications and monitor your lab values.

## 2017-07-23 NOTE — Care Management Note (Signed)
Case Management Note  Patient Details  Name: Sheila SitesJennifer E Bell MRN: [id number] Date of Birth: 03/20/1975  Subjective/Objective:  Pt admitted on 07/21/17 to ED for evaluation of malaise, diffuse aches, mild nonproductive cough, lightheadedness on standing, generalized weakness, and abnormally heavy and long menstrual periods for the past 6 weeks.  Hemoglobin on presentation 6.9.  PTA, pt independent of ADLS.                      Action/Plan: Pt medically stable for discharge today.  Pt states she plans to dc home with her mother.  Pt is uninsured, but is eligible for medication assistance through [facility]. [facility]MATCH letter given with explanation of program benefits.  Pt has no PCP; we discussed [facility], and importance of calling early Monday AM for hospital follow up appt when schedule opens.  She and her son verbalize understanding.   Expected Discharge Date:  07/23/17               Expected Discharge Plan:  Home/Self Care  In-House Referral:     Discharge planning Services  CM Consult, MATCH Program, Indigent [facility] Clinic  Post Acute Care Choice:    Choice offered to:     DME Arranged:    DME Agency:     HH Arranged:    HH Agency:     Status of Service:  Completed, signed off  If discussed at [employer] Meetings, dates discussed:    Additional Comments:  Glennon Macmerson, Donterius Filley M, RN 07/23/2017, 3:01 PM

## 2017-07-23 NOTE — Progress Notes (Signed)
Patient given d/c instructions including printed prescriptions, no equipment to give.  IV removed. Patient taken to car via wheelchair.

## 2017-07-23 NOTE — Discharge Summary (Signed)
Sheila Bell:[id number] DOB: 02/09/1975 DOA: 07/21/2017  PCP: Patient, No Pcp Per  Admit date: 07/21/2017  Discharge date: 07/23/2017  Admitted From: Home  Disposition:  Home   Recommendations for Outpatient Follow-up:   Follow up with PCP in 1-2 weeks  PCP Please obtain CMP/CBC, 2 view CXR in 1week,  (see Discharge instructions)   PCP Please follow up on the following pending results: Urine Culture results   Home Health: None   Equipment/Devices: None  Consultations: None Discharge Condition: Fair   CODE STATUS: Full   Diet Recommendation: Heart Healthy     Chief Complaint  Patient presents with  . Abnormal Lab     Brief history of present illness from the day of admission and additional interim summary    Sheila Bell is a 42 y.o. female with medical history significant for tobacco abuse, now presenting to the emergency department for evaluation of malaise, diffuse aches, mild nonproductive cough, lightheadedness on standing, generalized weakness, and abnormally heavy and long menstrual periods for the past 6 weeks.  Patient reports heavy menstrual bleeding for the past 6 weeks and 1 week of general malaise, generalized weakness, lightheadedness on standing, mild nonproductive cough, and diffuse aches.  She was evaluated at an urgent care for these complaints, noted to be orthostatic with low-grade temp, and she was directed to the ED for further evaluation.  In the ER she was found to have profuse anemia, UTI and evidence of bacterial vaginosis.                                                                  Hospital Course    1.  Anemia.  Due to severe chronic menstrual blood loss - she received 2 units of packed RBCs with good effect, she will be placed on NSAIDs for heavy menstrual, also  place her on oral iron and have requested her to follow with [facility] physician within a week for further management and follow-up.  2.  Bacterial vaginosis and UTI.  7 days of oral Flagyl and 3 days of oral Keflex respectively.  No sepsis.  3.  Hypokalemia.  Replaced.  4.  ARF.  Due to severe anemia resolved.  5.  Abdominal pain upon admission.  Nonspecifically elevated LFTs and some nonspecific right upper quadrant ultrasound finding, patient tolerating diet and completely pain-free this morning, exam unremarkable, will trend LFTs and monitor.  If better outpatient PCP follow-up.  Negative Murphy sign patient is completely symptom-free.  6.  History of smoking, cocaine abuse.  Counseled to quit.    Discharge diagnosis     Principal Problem:   Symptomatic anemia Active Problems:   Menorrhagia   Hypokalemia   Hyponatremia   Renal insufficiency   Orthostasis   Protein calorie malnutrition (HCC)  SIRS (systemic inflammatory response syndrome) (HCC)   Elevated serum hCG   RUQ pain    Discharge instructions    Discharge Instructions    Diet - low sodium heart healthy   Complete by:  As directed    Discharge instructions   Complete by:  As directed    Follow with Primary MD and the recommended OB physician in 7 days   Get CBC, CMP, iron panel checked  by Primary MD in 5-7 days   Activity: As tolerated with Full fall precautions use walker/cane & assistance as needed  Disposition Home   Diet:   Heart Healthy   For Heart failure patients - Check your Weight same time everyday, if you gain over 2 pounds, or you develop in leg swelling, experience more shortness of breath or chest pain, call your Primary MD immediately. Follow Cardiac Low Salt Diet and 1.5 lit/day fluid restriction.  Special Instructions: If you have smoked or chewed Tobacco  in the last 2 yrs please stop smoking, stop any regular Alcohol  and or any Recreational drug use.  On your next visit with your primary  care physician please Get Medicines reviewed and adjusted.  Please request your Prim.MD to go over all Hospital Tests and Procedure/Radiological results at the follow up, please get all Hospital records sent to your Prim MD by signing hospital release before you go home.  If you experience worsening of your admission symptoms, develop shortness of breath, life threatening emergency, suicidal or homicidal thoughts you must seek medical attention immediately by calling 911 or calling your MD immediately  if symptoms less severe.  You Must read complete instructions/literature along with all the possible adverse reactions/side effects for all the Medicines you take and that have been prescribed to you. Take any new Medicines after you have completely understood and accpet all the possible adverse reactions/side effects.   Do not drive, operate heavy machinery, perform activities at heights, swimming or participation in water activities or provide baby sitting services if your were admitted for syncope or siezures until you have seen by Primary MD or a Neurologist and advised to do so again.  Do not drive when taking Pain medications.    Do not take more than prescribed Pain, Sleep and Anxiety Medications  Wear Seat belts while driving.   Please note  You were cared for by a hospitalist during your hospital stay. If you have any questions about your discharge medications or the care you received while you were in the hospital after you are discharged, you can call the unit and asked to speak with the hospitalist on call if the hospitalist that took care of you is not available. Once you are discharged, your primary care physician will handle any further medical issues. Please note that NO REFILLS for any discharge medications will be authorized once you are discharged, as it is imperative that you return to your primary care physician (or establish a relationship with a primary care physician if you  do not have one) for your aftercare needs so that they can reassess your need for medications and monitor your lab values.   Increase activity slowly   Complete by:  As directed       Discharge Medications   Allergies as of 07/23/2017   No Known Allergies     Medication List    STOP taking these medications   ibuprofen 800 MG tablet Commonly known as:  ADVIL,MOTRIN     TAKE these medications  cephALEXin 500 MG capsule Commonly known as:  KEFLEX Take 1 capsule (500 mg total) by mouth 3 (three) times daily for 9 doses.   ferrous sulfate 325 (65 FE) MG EC tablet Take 1 tablet (325 mg total) by mouth 2 (two) times daily.   metroNIDAZOLE 500 MG tablet Commonly known as:  FLAGYL Take 1 tablet (500 mg total) by mouth 2 (two) times daily for 7 days.   naproxen sodium 220 MG tablet Commonly known as:  ALEVE Take 2 tablets (440 mg total) by mouth 2 (two) times daily as needed (pian, fever).   omeprazole 20 MG capsule Commonly known as:  PRILOSEC Take 1 capsule (20 mg total) by mouth daily as needed (acid reflux).       Follow-up Information    Lesly Dukes, MD. Schedule an appointment as soon as possible for a visit in 3 day(s).   Specialty:  Obstetrics and Gynecology Why:  Heavy menstrual blood loss, bacterial vaginosis Contact information: [address] [place] [address] [phone]        [facility]. Schedule an appointment as soon as possible for a visit in 1 week(s).   Why:  CALL MONDAY 07/26/17 AM AT 8AM TO SCHEDULE HOSPITAL FOLLOW UP/PCP APPOINTMENT.  Contact information: [address] [place] [address] [phone]          Major procedures and Radiology Reports - PLEASE review detailed and final reports thoroughly  -       Dg Chest 2 View  Result Date: 07/21/2017 CLINICAL DATA:  42 year old female with cough. EXAM: CHEST - 2 VIEW COMPARISON:  None. FINDINGS: The lungs are clear.  There is no pleural effusion or pneumothorax. The cardiac silhouette is within normal limits. Bilateral nipple shadows noted. No acute osseous pathology. IMPRESSION: No active cardiopulmonary disease. Electronically Signed   By: Elgie Collard M.D.   On: 07/21/2017 23:39   US Abdomen Complete  Result Date: 07/22/2017 CLINICAL DATA:  42 y/o F; right upper quadrant abdominal pain tonight. EXAM: ABDOMEN ULTRASOUND COMPLETE COMPARISON:  None. FINDINGS: Gallbladder: Gallbladder wall is thickened to 4 mm and there is pericholecystic and perihepatic fluid. No cholelithiasis. Negative sonographic Murphy's sign. Common bile duct: Diameter: 4.2 mm Liver: No focal liver lesion. Normal liver echogenicity. Portal vein is patent on color Doppler imaging with normal direction of blood flow towards the liver. IVC: No abnormality visualized. Pancreas: Visualized portion unremarkable. Spleen: Size and appearance within normal limits. Right Kidney: Length: 12.4 cm. Echogenicity within normal limits. No mass or hydronephrosis visualized. Left Kidney: Length: 12.4 cm. Echogenicity within normal limits. No mass or hydronephrosis visualized. Abdominal aorta: No aneurysm visualized. Other findings: Trace perihepatic ascites. IMPRESSION: Gallbladder wall thickening and trace pericholecystic/perihepatic ascites. No gallstone. No biliary ductal dilatation. Findings are nonspecific and may represent cholecystitis, reactive changes due to local inflammation, heart failure, or hypoproteinemia. Electronically Signed   By: Mitzi Hansen M.D.   On: 07/22/2017 02:20    Micro Results     Recent Results (from the past 240 hour(s))  Wet prep, genital     Status: Abnormal   Collection Time: 07/22/17  1:53 AM  Result Value Ref Range Status   Yeast Wet Prep HPF POC NONE SEEN NONE SEEN Final   Trich, Wet Prep NONE SEEN NONE SEEN Final   Clue Cells Wet Prep HPF POC PRESENT (A) NONE SEEN Final   WBC, Wet Prep HPF POC FEW (A)  NONE SEEN Final  Sperm NONE SEEN  Final    Comment: Performed at [facility], [address], [place], [place] [address]  MRSA PCR Screening     Status: None   Collection Time: 07/22/17  2:33 AM  Result Value Ref Range Status   MRSA by PCR NEGATIVE NEGATIVE Final    Comment:        The GeneXpert MRSA Assay (FDA approved for NASAL specimens only), is one component of a comprehensive MRSA colonization surveillance program. It is not intended to diagnose MRSA infection nor to guide or monitor treatment for MRSA infections. Performed at [facility], [address], [place], [place] [address]   Urine culture     Status: Abnormal (Preliminary result)   Collection Time: 07/22/17  3:01 AM  Result Value Ref Range Status   Specimen Description URINE, RANDOM  Final   Special Requests   Final    NONE Performed at [facility], [address], [place], [place] [address]    Culture >=100,000 COLONIES/mL ESCHERICHIA COLI (A)  Final   Report Status PENDING  Incomplete  Culture, blood (routine x 2)     Status: None (Preliminary result)   Collection Time: 07/22/17  8:09 AM  Result Value Ref Range Status   Specimen Description BLOOD RIGHT HAND  Final   Special Requests   Final    BOTTLES DRAWN AEROBIC AND ANAEROBIC Blood Culture adequate volume   Culture   Final    NO GROWTH 1 DAY Performed at [facility], [address], [place], [place] [address]    Report Status PENDING  Incomplete  Culture, blood (routine x 2)     Status: None (Preliminary result)   Collection Time: 07/22/17  8:20 AM  Result Value Ref Range Status   Specimen Description BLOOD RIGHT ARM  Final   Special Requests   Final    BOTTLES DRAWN AEROBIC AND ANAEROBIC Blood Culture results may not be optimal due to an inadequate volume of blood received in culture bottles   Culture   Final    NO GROWTH 1 DAY Performed at [facility], [address] [place], [place] [address]    Report  Status PENDING  Incomplete    Today   Subjective    Ly Wass today has no headache,no chest abdominal pain,no new weakness tingling or numbness, feels much better wants to go home today.    Objective   Blood pressure 100/72, pulse 98, temperature 98.3 F (36.8 C), temperature source Oral, resp. rate 16, height 5\' 1"  (1.549 m), weight 57.1 kg (125 lb 14.1 oz), last menstrual period 07/14/2017, SpO2 100 %.   Intake/Output Summary (Last 24 hours) at 07/23/2017 1842 Last data filed at 07/23/2017 1604 Gross per 24 hour  Intake 665.07 ml  Output 5 ml  Net 660.07 ml    Exam Awake Alert, Oriented x 3, No new F.N deficits, Normal affect [place].AT,PERRAL Supple Neck,No JVD, No cervical lymphadenopathy appriciated.  Symmetrical Chest wall movement, Good air movement bilaterally, CTAB RRR,No Gallops,Rubs or new Murmurs, No Parasternal Heave +ve B.Sounds, Abd Soft, Non tender, No organomegaly appriciated, No rebound -guarding or rigidity. No Cyanosis, Clubbing or edema, No new Rash or bruise   Data Review   CBC w Diff:  Lab Results  Component Value Date   WBC 7.4 07/23/2017   HGB 8.9 (L) 07/23/2017   HCT 28.6 (L) 07/23/2017   PLT 171 07/23/2017    CMP:  Lab Results  Component Value Date   NA 138 07/23/2017   K 3.4 (L) 07/23/2017   CL 109 07/23/2017   CO2 22 07/23/2017   BUN 19 07/23/2017   CREATININE 1.19 (H) 07/23/2017   PROT 4.8 (L) 07/23/2017   ALBUMIN 1.8 (L) 07/23/2017   BILITOT 1.3 (H) 07/23/2017   ALKPHOS 221 (H) 07/23/2017   AST 91 (H) 07/23/2017   ALT 70 (H) 07/23/2017  .   Total Time in preparing paper work, data evaluation and todays exam - 35 minutes  Susa Raring M.D on 07/23/2017 at 6:42 PM  [facility]   Office  [phone]

## 2017-07-24 LAB — URINE CULTURE: Culture: >=100000 — AB

## 2017-07-27 LAB — CULTURE, BLOOD (ROUTINE X 2)
CULTURE: NO GROWTH
CULTURE: NO GROWTH
SPECIAL REQUESTS: ADEQUATE

## 2017-08-05 ENCOUNTER — Inpatient Hospital Stay: Payer: Medicaid Other

## 2017-08-12 ENCOUNTER — Other Ambulatory Visit (HOSPITAL_COMMUNITY)
Admission: RE | Admit: 2017-08-12 | Discharge: 2017-08-12 | Disposition: A | Discharge status: Home / Self Care | Payer: Medicaid Other | Source: Ambulatory Visit | Attending: Family Medicine | Admitting: Family Medicine

## 2017-08-12 ENCOUNTER — Ambulatory Visit (INDEPENDENT_AMBULATORY_CARE_PROVIDER_SITE_OTHER): Payer: Self-pay | Admitting: Family Medicine

## 2017-08-12 ENCOUNTER — Encounter: Payer: Self-pay | Admitting: Family Medicine

## 2017-08-12 VITALS — BP 113/71 | HR 85 | Ht 64.0 in | Wt 107.0 lb

## 2017-08-12 DIAGNOSIS — N939 Abnormal uterine and vaginal bleeding, unspecified: Secondary | ICD-10-CM

## 2017-08-12 DIAGNOSIS — N920 Excessive and frequent menstruation with regular cycle: Secondary | ICD-10-CM

## 2017-08-12 DIAGNOSIS — N898 Other specified noninflammatory disorders of vagina: Secondary | ICD-10-CM | POA: Insufficient documentation

## 2017-08-12 DIAGNOSIS — D509 Iron deficiency anemia, unspecified: Secondary | ICD-10-CM

## 2017-08-12 NOTE — Progress Notes (Signed)
   Subjective:    Patient ID: Sheila Bell, female    DOB: 05/20/1975, 42 y.o.   MRN: [id number]  HPI Patient seen for follow up of anemia with AUB. Had 3-4 weeks of irregular bleeding. Would have heavy bleeding for 7 days, then stop for 7 days, then resumed. She went to urgent care, found to be severely anemic, then sent to hospital. She had blood transfusion, placed on aleve. Her bleeding stopped and she has not resumed her periods yet. She has stopped the aleve.  Has history of dysmenorrhea with menorrhagia. Periods usually regular, 28days.   Review of Systems     Objective:   Physical Exam  Constitutional: She is oriented to person, place, and time. She appears well-developed and well-nourished.  Cardiovascular: Normal rate, regular rhythm and normal heart sounds.  Pulmonary/Chest: Effort normal. No stridor. No respiratory distress. She has no wheezes. She has no rales.  Abdominal: Hernia confirmed negative in the right inguinal area and confirmed negative in the left inguinal area.  Genitourinary: Vagina normal. There is no rash, tenderness, lesion or injury on the right labia. There is no rash, tenderness, lesion or injury on the left labia. Uterus is not deviated, not enlarged, not fixed and not tender. Cervix exhibits no motion tenderness, no discharge and no friability. Right adnexum displays no mass, no tenderness and no fullness. Left adnexum displays no mass, no tenderness and no fullness. No erythema, tenderness or bleeding in the vagina. No foreign body in the vagina. No signs of injury around the vagina. No vaginal discharge found.  Lymphadenopathy: Inguinal adenopathy noted on the right and left side.  Neurological: She is alert and oriented to person, place, and time.  Skin: Skin is warm and dry.  Psychiatric: She has a normal mood and affect. Her behavior is normal. Thought content normal.       Assessment & Plan:  1. Vaginal discharge Wet prep with GC/CT.  2.  Abnormal uterine bleeding (AUB) Improved. Will evaluate endometrium with transvaginal US. If normal, then can insert IUD to control menorrhagia and dysmenorrhea. - US PELVIS TRANSVANGINAL NON-OB (TV ONLY); Future  3. Iron deficiency anemia, unspecified iron deficiency anemia type

## 2017-08-13 ENCOUNTER — Telehealth: Payer: Self-pay

## 2017-08-13 NOTE — Telephone Encounter (Signed)
Pt came in to request a medication refill on

## 2017-08-16 ENCOUNTER — Encounter: Payer: Self-pay | Admitting: Nurse Practitioner

## 2017-08-16 ENCOUNTER — Ambulatory Visit: Payer: Self-pay

## 2017-08-16 ENCOUNTER — Ambulatory Visit: Payer: Self-pay | Attending: Nurse Practitioner | Admitting: Nurse Practitioner

## 2017-08-16 VITALS — BP 103/69 | HR 83 | Temp 98.0°F | Ht 64.0 in | Wt 108.4 lb

## 2017-08-16 DIAGNOSIS — R7989 Other specified abnormal findings of blood chemistry: Secondary | ICD-10-CM | POA: Insufficient documentation

## 2017-08-16 DIAGNOSIS — R6510 Systemic inflammatory response syndrome (SIRS) of non-infectious origin without acute organ dysfunction: Secondary | ICD-10-CM

## 2017-08-16 DIAGNOSIS — K219 Gastro-esophageal reflux disease without esophagitis: Secondary | ICD-10-CM | POA: Insufficient documentation

## 2017-08-16 DIAGNOSIS — R945 Abnormal results of liver function studies: Secondary | ICD-10-CM

## 2017-08-16 DIAGNOSIS — D649 Anemia, unspecified: Secondary | ICD-10-CM | POA: Insufficient documentation

## 2017-08-16 LAB — CERVICOVAGINAL ANCILLARY ONLY
BACTERIAL VAGINITIS: NEGATIVE
CANDIDA VAGINITIS: POSITIVE — AB
Chlamydia: NEGATIVE
Neisseria Gonorrhea: NEGATIVE
Trichomonas: NEGATIVE

## 2017-08-16 MED ORDER — OMEPRAZOLE 20 MG PO CPDR: 20.0000 mg | DELAYED_RELEASE_CAPSULE | Freq: Every day | ORAL | 0 refills | Status: DC | PRN

## 2017-08-16 MED FILL — ?OMEPRazole 20mg CPDR: 20 | 30 days supply | Qty: 30 | Fill #0

## 2017-08-16 NOTE — Patient Instructions (Addendum)
If you are approved for the financial assistance prior to your next appt. Please call the office and let us know. I will go ahead and refer you to GI and have an [place] ordered for your left arm.   Anemia Anemia is a condition in which you do not have enough red blood cells or hemoglobin. Hemoglobin is a substance in red blood cells that carries oxygen. When you do not have enough red blood cells or hemoglobin (are anemic), your body cannot get enough oxygen and your organs may not work properly. As a result, you may feel very tired or have other problems. What are the causes? Common causes of anemia include:  Excessive bleeding. Anemia can be caused by excessive bleeding inside or outside the body, including bleeding from the intestine or from periods in women.  Poor nutrition.  Long-lasting (chronic) kidney, thyroid, and liver disease.  Bone marrow disorders.  Cancer and treatments for cancer.  HIV (human immunodeficiency virus) and AIDS (acquired immunodeficiency syndrome).  Treatments for HIV and AIDS.  Spleen problems.  Blood disorders.  Infections, medicines, and autoimmune disorders that destroy red blood cells.  What are the signs or symptoms? Symptoms of this condition include:  Minor weakness.  Dizziness.  Headache.  Feeling heartbeats that are irregular or faster than normal (palpitations).  Shortness of breath, especially with exercise.  Paleness.  Cold sensitivity.  Indigestion.  Nausea.  Difficulty sleeping.  Difficulty concentrating.  Symptoms may occur suddenly or develop slowly. If your anemia is mild, you may not have symptoms. How is this diagnosed? This condition is diagnosed based on:  Blood tests.  Your medical history.  A physical exam.  Bone marrow biopsy.  Your health care provider may also check your stool (feces) for blood and may do additional testing to look for the cause of your bleeding. You may also have other tests,  including:  Imaging tests, such as a CT scan or MRI.  Endoscopy.  Colonoscopy.  How is this treated? Treatment for this condition depends on the cause. If you continue to lose a lot of blood, you may need to be treated at a hospital. Treatment may include:  Taking supplements of iron, vitamin Z30, or folic acid.  Taking a hormone medicine (erythropoietin) that can help to stimulate red blood cell growth.  Having a blood transfusion. This may be needed if you lose a lot of blood.  Making changes to your diet.  Having surgery to remove your spleen.  Follow these instructions at home:  Take over-the-counter and prescription medicines only as told by your health care provider.  Take supplements only as told by your health care provider.  Follow any diet instructions that you were given.  Keep all follow-up visits as told by your health care provider. This is important. Contact a health care provider if:  You develop new bleeding anywhere in the body. Get help right away if:  You are very weak.  You are short of breath.  You have pain in your abdomen or chest.  You are dizzy or feel faint.  You have trouble concentrating.  You have bloody or black, tarry stools.  You vomit repeatedly or you vomit up blood. Summary  Anemia is a condition in which you do not have enough red blood cells or enough of a substance in your red blood cells that carries oxygen (hemoglobin).  Symptoms may occur suddenly or develop slowly.  If your anemia is mild, you may not have symptoms.  This condition is diagnosed with blood tests as well as a medical history and physical exam. Other tests may be needed.  Treatment for this condition depends on the cause of the anemia. This information is not intended to replace advice given to you by your health care provider. Make sure you discuss any questions you have with your health care provider. Document Released: 01/30/2004 Document Revised:  01/24/2016 Document Reviewed: 01/24/2016 Elsevier Interactive Patient Education  [employer].

## 2017-08-16 NOTE — Progress Notes (Addendum)
Assessment & Plan:  Domino was seen today for new patient (initial visit).  Diagnoses and all orders for this visit:  Symptomatic anemia -     CBC  SIRS (systemic inflammatory response syndrome) (HCC) Resolved  Gastroesophageal reflux disease, esophagitis presence not specified -     omeprazole (PRILOSEC) 20 MG capsule; Take 1 capsule (20 mg total) by mouth daily as needed (acid reflux).  Elevated LFTs -     CMP14+EGFR   Patient has been counseled on age-appropriate routine health concerns for screening and prevention. These are reviewed and up-to-date. Referrals have been placed accordingly. Immunizations are up-to-date or declined.    Subjective:   Chief Complaint  Patient presents with  . New Patient (Initial Visit)    Pt. is here to establish care.    HPI Sheila Bell 42 y.o. female presents to office today for hospital follow up and to establish care.   Hospital Follow Up Patient was admitted to the hospital on 07-21-2017 and treated for symptomatic anemia, BV and UTI. She required 2 units or PRBCs while she was admitted. She was discharged home on oral iron, NSAIDS, Flagyl and Oral Keflex. She was also referred to GYN due to menorrhagia.  Today she reports she was also diagnosed with a "stomach ulcer". However she is not taking Omeprazole. She states she thought the omeprazole was causing her bleeding so she didn't take it. I have instructed her she will need to continue on the omeprazole until evaluated by GI.  I do not see any findings from her CT abdomen in regards to gastric ulcer but results did show "Gallbladder wall thickening and trace pericholecystic/perihepatic ascites. No gallstone. No biliary ductal dilatation. Findings are nonspecific and may represent cholecystitis, reactive changes due to local inflammation, heart failure, or hypoproteinemia."  Review of Systems  Constitutional: Negative for fever, malaise/fatigue and weight loss.  HENT: Negative.   Negative for nosebleeds.   Eyes: Negative.  Negative for blurred vision, double vision and photophobia.  Respiratory: Negative.  Negative for cough and shortness of breath.   Cardiovascular: Negative.  Negative for chest pain, palpitations and leg swelling.  Gastrointestinal: Positive for abdominal pain and heartburn. Negative for nausea and vomiting.  Musculoskeletal: Negative.  Negative for myalgias.  Neurological: Negative.  Negative for dizziness, focal weakness, seizures and headaches.  Psychiatric/Behavioral: Negative.  Negative for suicidal ideas.    Past Medical History:  Diagnosis Date  . Anemia   . History of stomach ulcers     Past Surgical History:  Procedure Laterality Date  . TUBAL LIGATION      Family History  Problem Relation Age of Onset  . Hypertension Father     Social History Reviewed with no changes to be made today.   Outpatient Medications Prior to Visit  Medication Sig Dispense Refill  . ferrous sulfate 325 (65 FE) MG EC tablet Take 1 tablet (325 mg total) by mouth 2 (two) times daily. 60 tablet 11  . naproxen sodium (ALEVE) 220 MG tablet Take 2 tablets (440 mg total) by mouth 2 (two) times daily as needed (pian, fever). (Patient not taking: Reported on 08/16/2017) 20 tablet 0  . omeprazole (PRILOSEC) 20 MG capsule Take 1 capsule (20 mg total) by mouth daily as needed (acid reflux). (Patient not taking: Reported on 08/16/2017) 30 capsule 0   No facility-administered medications prior to visit.     No Known Allergies     Objective:    BP 103/69 (BP Location: Right Arm,  Patient Position: Sitting, Cuff Size: Normal)   Pulse 83   Temp 98 F (36.7 C) (Oral)   Ht '5\' 4"'$  (1.626 m)   Wt 108 lb 6.4 oz (49.2 kg)   LMP  (LMP Unknown)   SpO2 100%   BMI 18.61 kg/m  Wt Readings from Last 3 Encounters:  08/16/17 108 lb 6.4 oz (49.2 kg)  08/12/17 107 lb (48.5 kg)  07/23/17 125 lb 14.1 oz (57.1 kg)    Physical Exam  Constitutional: She is oriented to  person, place, and time. She appears well-developed and well-nourished. She is cooperative.  HENT:  Head: Normocephalic and atraumatic.  Eyes: EOM are normal.  Neck: Normal range of motion.  Cardiovascular: Normal rate, regular rhythm and normal heart sounds. Exam reveals no gallop and no friction rub.  No murmur heard. Pulmonary/Chest: Effort normal and breath sounds normal. No tachypnea. No respiratory distress. She has no decreased breath sounds. She has no wheezes. She has no rhonchi. She has no rales. She exhibits no tenderness.  Abdominal: Soft. Bowel sounds are normal. She exhibits distension. There is tenderness in the right upper quadrant.  Musculoskeletal: Normal range of motion. She exhibits no edema.  Neurological: She is alert and oriented to person, place, and time. Coordination normal.  Skin: Skin is warm and dry.     Psychiatric: She has a normal mood and affect. Her behavior is normal. Judgment and thought content normal.  Nursing note and vitals reviewed.      Patient has been counseled extensively about nutrition and exercise as well as the importance of adherence with medications and regular follow-up. The patient was given clear instructions to go to ER or return to medical center if symptoms don't improve, worsen or new problems develop. The patient verbalized understanding.   Follow-up: Return in about 2 months (around 10/16/2017) for Physical ONLY no labs.   Gildardo Pounds, FNP-BC [facility], [place]   08/16/2017, 9:18 PM

## 2017-08-17 ENCOUNTER — Ambulatory Visit (HOSPITAL_COMMUNITY)
Admission: RE | Admit: 2017-08-17 | Discharge: 2017-08-17 | Disposition: A | Discharge status: Home / Self Care | Payer: Medicaid Other | Source: Ambulatory Visit | Attending: Family Medicine | Admitting: Family Medicine

## 2017-08-17 DIAGNOSIS — N939 Abnormal uterine and vaginal bleeding, unspecified: Secondary | ICD-10-CM | POA: Insufficient documentation

## 2017-08-17 DIAGNOSIS — R9389 Abnormal findings on diagnostic imaging of other specified body structures: Secondary | ICD-10-CM | POA: Insufficient documentation

## 2017-08-17 LAB — CMP14+EGFR
ALT: 11 IU/L (ref 0–32)
AST: 22 IU/L (ref 0–40)
Albumin/Globulin Ratio: 1.4 (ref 1.2–2.2)
Albumin: 3.9 g/dL (ref 3.5–5.5)
Alkaline Phosphatase: 61 IU/L (ref 39–117)
BUN/Creatinine Ratio: 10 (ref 9–23)
BUN: 10 mg/dL (ref 6–24)
Bilirubin Total: 0.2 mg/dL (ref 0.0–1.2)
CALCIUM: 9.5 mg/dL (ref 8.7–10.2)
CO2: 24 mmol/L (ref 20–29)
CREATININE: 1.01 mg/dL — AB (ref 0.57–1.00)
Chloride: 104 mmol/L (ref 96–106)
GFR, EST AFRICAN AMERICAN: 79 mL/min/{1.73_m2} (ref >59)
GFR, EST NON AFRICAN AMERICAN: 68 mL/min/{1.73_m2} (ref >59)
Globulin, Total: 2.7 g/dL (ref 1.5–4.5)
Glucose: 70 mg/dL (ref 65–99)
Potassium: 4.3 mmol/L (ref 3.5–5.2)
Sodium: 142 mmol/L (ref 134–144)
TOTAL PROTEIN: 6.6 g/dL (ref 6.0–8.5)

## 2017-08-17 LAB — CBC
HEMATOCRIT: 34.6 % (ref 34.0–46.6)
Hemoglobin: 11.1 g/dL (ref 11.1–15.9)
MCH: 25.5 pg — ABNORMAL LOW (ref 26.6–33.0)
MCHC: 32.1 g/dL (ref 31.5–35.7)
MCV: 79 fL (ref 79–97)
PLATELETS: 184 10*3/uL (ref 150–450)
RBC: 4.36 x10E6/uL (ref 3.77–5.28)
RDW: 25.1 % — AB (ref 12.3–15.4)
WBC: 5.1 10*3/uL (ref 3.4–10.8)

## 2017-08-20 ENCOUNTER — Other Ambulatory Visit: Payer: Self-pay | Admitting: Family Medicine

## 2017-08-20 MED ORDER — FLUCONAZOLE 150 MG PO TABS: 150.0000 mg | ORAL_TABLET | Freq: Once | ORAL | 3 refills | Status: AC

## 2017-08-30 ENCOUNTER — Telehealth: Payer: Self-pay | Admitting: Nurse Practitioner

## 2017-08-30 NOTE — Telephone Encounter (Signed)
Pt came in to drop off  A report of medical examination paperwork, please follow up with patient when completed. And fax to [phone] Attn: Rozanna BoxPriscilla Williams

## 2017-08-31 NOTE — Telephone Encounter (Signed)
CMA attempt to call patient to inform her letter is ready for pick up and CMA faxed it also.

## 2017-09-01 ENCOUNTER — Inpatient Hospital Stay: Payer: Medicaid Other | Admitting: Nurse Practitioner

## 2017-09-02 ENCOUNTER — Encounter: Payer: Self-pay | Admitting: *Deleted

## 2017-09-16 ENCOUNTER — Ambulatory Visit (INDEPENDENT_AMBULATORY_CARE_PROVIDER_SITE_OTHER): Payer: Medicaid Other | Admitting: Family Medicine

## 2017-09-16 ENCOUNTER — Other Ambulatory Visit (HOSPITAL_COMMUNITY)
Admission: RE | Admit: 2017-09-16 | Discharge: 2017-09-16 | Disposition: A | Discharge status: Home / Self Care | Payer: Medicaid Other | Source: Ambulatory Visit | Attending: Family Medicine | Admitting: Family Medicine

## 2017-09-16 ENCOUNTER — Encounter: Payer: Self-pay | Admitting: Family Medicine

## 2017-09-16 VITALS — BP 113/68 | HR 84 | Ht 64.0 in | Wt 107.0 lb

## 2017-09-16 DIAGNOSIS — N939 Abnormal uterine and vaginal bleeding, unspecified: Secondary | ICD-10-CM | POA: Diagnosis not present

## 2017-09-16 DIAGNOSIS — Z0000 Encounter for general adult medical examination without abnormal findings: Secondary | ICD-10-CM | POA: Diagnosis not present

## 2017-09-16 NOTE — Progress Notes (Signed)
GYNECOLOGY ANNUAL PREVENTATIVE CARE ENCOUNTER NOTE  Subjective:   Sheila Bell is a 42 y.o. [id number] female here for a routine annual gynecologic exam.  Current complaints: abnormal bleeding - heavy periods every 21-28 days. Bleeding has improved since last appt.   Denies abnormal vaginal bleeding, discharge, pelvic pain, problems with intercourse or other gynecologic concerns.    Gynecologic History Patient's last menstrual period was 09/16/2017. Patient is not sexually active  Contraception: none Last Pap: uncertain. Results were: normal Last mammogram: n/a.  Obstetric History OB History  Gravida Para Term Preterm AB Living  2 2 2  0 0 2  SAB TAB Ectopic Multiple Live Births  0 0 0 0 2    # Outcome Date GA Lbr Len/2nd Weight Sex Delivery Anes PTL Lv  2 Term           1 Term             Past Medical History:  Diagnosis Date  . Anemia   . History of stomach ulcers     Past Surgical History:  Procedure Laterality Date  . TUBAL LIGATION      Current Outpatient Medications on File Prior to Visit  Medication Sig Dispense Refill  . ferrous sulfate 325 (65 FE) MG EC tablet Take 1 tablet (325 mg total) by mouth 2 (two) times daily. 60 tablet 11  . omeprazole (PRILOSEC) 20 MG capsule Take 1 capsule (20 mg total) by mouth daily as needed (acid reflux). 90 capsule 0   No current facility-administered medications on file prior to visit.     No Known Allergies  Social History   Socioeconomic History  . Marital status: Single    Spouse name: Not on file  . Number of children: Not on file  . Years of education: Not on file  . Highest education level: Not on file  Occupational History  . Not on file  Social Needs  . Financial resource strain: Not on file  . Food insecurity:    Worry: Not on file    Inability: Not on file  . Transportation needs:    Medical: Not on file    Non-medical: Not on file  Tobacco Use  . Smoking status: Current Every Day Smoker   Packs/day: 1.00    Years: 15.00    Pack years: 15.00    Types: Cigarettes  . Smokeless tobacco: Never Used  Substance and Sexual Activity  . Alcohol use: No  . Drug use: No  . Sexual activity: Not Currently    Birth control/protection: Surgical  Lifestyle  . Physical activity:    Days per week: Not on file    Minutes per session: Not on file  . Stress: Not on file  Relationships  . Social connections:    Talks on phone: Not on file    Gets together: Not on file    Attends religious service: Not on file    Active member of club or organization: Not on file    Attends meetings of clubs or organizations: Not on file    Relationship status: Not on file  . Intimate partner violence:    Fear of current or ex partner: Not on file    Emotionally abused: Not on file    Physically abused: Not on file    Forced sexual activity: Not on file  Other Topics Concern  . Not on file  Social History Narrative  . Not on file    Family History  Problem Relation Age of Onset  . Hypertension Father     The following portions of the patient's history were reviewed and updated as appropriate: allergies, current medications, past family history, past medical history, past social history, past surgical history and problem list.  Review of Systems Pertinent items noted in HPI and remainder of comprehensive ROS otherwise negative.   Objective:  BP 113/68   Pulse 84   Ht 5\' 4"  (1.626 m)   Wt 107 lb (48.5 kg)   LMP 09/16/2017   BMI 18.37 kg/m  CONSTITUTIONAL: Well-developed, well-nourished female in no acute distress.  HENT:  Normocephalic, atraumatic, External right and left ear normal. Oropharynx is clear and moist EYES: Conjunctivae and EOM are normal. Pupils are equal, round, and reactive to light. No scleral icterus.  NECK: Normal range of motion, supple, no masses.  Normal thyroid.   CARDIOVASCULAR: Normal heart rate noted, regular rhythm RESPIRATORY: Clear to auscultation  bilaterally. Effort and breath sounds normal, no problems with respiration noted. BREASTS: Symmetric in size. No masses, skin changes, nipple drainage, or lymphadenopathy. ABDOMEN: Soft, normal bowel sounds, no distention noted.  No tenderness, rebound or guarding.  PELVIC: Normal appearing external genitalia; normal appearing vaginal mucosa and cervix.  No abnormal discharge noted.  Pap smear obtained.  Normal uterine size, no other palpable masses, no uterine or adnexal tenderness. MUSCULOSKELETAL: Normal range of motion. No tenderness.  No cyanosis, clubbing, or edema.  2+ distal pulses. SKIN: Skin is warm and dry. No rash noted. Not diaphoretic. No erythema. No pallor. NEUROLOGIC: Alert and oriented to person, place, and time. Normal reflexes, muscle tone coordination. No cranial nerve deficit noted. PSYCHIATRIC: Normal mood and affect. Normal behavior. Normal judgment and thought content.  Hysteroscopy: Consent signed. Paracervical block placed with 10mL lidocaine with epi. Endosee hysteroscope primed with sterile water. Hysteroscope placed through cervix and the uterine cavity was filled with water. Anterio/fundal polyp seen with small amount of bleeding. Hysteroscope removed. Pt tolerated procedure well.   Assessment:  Annual gynecologic examination with pap smear   Plan:  1. Well Woman Exam Will follow up results of pap smear and manage accordingly. Mammogram scheduled  2. Abnormal uterine bleeding (AUB) In office hysteroscopy done - appears like endometrial polyp. D/w Dr Debroah Loop - will arrange hysteroscopy with polypectomy at surgery center. - Cytology - PAP   Routine preventative health maintenance measures emphasized. Please refer to After Visit Summary for other counseling recommendations.    Candelaria Celeste, DO [facility]

## 2017-09-16 NOTE — H&P (View-Only) (Signed)
  GYNECOLOGY ANNUAL PREVENTATIVE CARE ENCOUNTER NOTE  Subjective:   Sheila Bell is a 42 y.o. G2P2002 female here for a routine annual gynecologic exam.  Current complaints: abnormal bleeding - heavy periods every 21-28 days. Bleeding has improved since last appt.   Denies abnormal vaginal bleeding, discharge, pelvic pain, problems with intercourse or other gynecologic concerns.    Gynecologic History Patient's last menstrual period was 09/16/2017. Patient is not sexually active  Contraception: none Last Pap: uncertain. Results were: normal Last mammogram: n/a.  Obstetric History OB History  Gravida Para Term Preterm AB Living  2 2 2 0 0 2  SAB TAB Ectopic Multiple Live Births  0 0 0 0 2    # Outcome Date GA Lbr Len/2nd Weight Sex Delivery Anes PTL Lv  2 Term           1 Term             Past Medical History:  Diagnosis Date  . Anemia   . History of stomach ulcers     Past Surgical History:  Procedure Laterality Date  . TUBAL LIGATION      Current Outpatient Medications on File Prior to Visit  Medication Sig Dispense Refill  . ferrous sulfate 325 (65 FE) MG EC tablet Take 1 tablet (325 mg total) by mouth 2 (two) times daily. 60 tablet 11  . omeprazole (PRILOSEC) 20 MG capsule Take 1 capsule (20 mg total) by mouth daily as needed (acid reflux). 90 capsule 0   No current facility-administered medications on file prior to visit.     No Known Allergies  Social History   Socioeconomic History  . Marital status: Single    Spouse name: Not on file  . Number of children: Not on file  . Years of education: Not on file  . Highest education level: Not on file  Occupational History  . Not on file  Social Needs  . Financial resource strain: Not on file  . Food insecurity:    Worry: Not on file    Inability: Not on file  . Transportation needs:    Medical: Not on file    Non-medical: Not on file  Tobacco Use  . Smoking status: Current Every Day Smoker   Packs/day: 1.00    Years: 15.00    Pack years: 15.00    Types: Cigarettes  . Smokeless tobacco: Never Used  Substance and Sexual Activity  . Alcohol use: No  . Drug use: No  . Sexual activity: Not Currently    Birth control/protection: Surgical  Lifestyle  . Physical activity:    Days per week: Not on file    Minutes per session: Not on file  . Stress: Not on file  Relationships  . Social connections:    Talks on phone: Not on file    Gets together: Not on file    Attends religious service: Not on file    Active member of club or organization: Not on file    Attends meetings of clubs or organizations: Not on file    Relationship status: Not on file  . Intimate partner violence:    Fear of current or ex partner: Not on file    Emotionally abused: Not on file    Physically abused: Not on file    Forced sexual activity: Not on file  Other Topics Concern  . Not on file  Social History Narrative  . Not on file    Family History    Problem Relation Age of Onset  . Hypertension Father     The following portions of the patient's history were reviewed and updated as appropriate: allergies, current medications, past family history, past medical history, past social history, past surgical history and problem list.  Review of Systems Pertinent items noted in HPI and remainder of comprehensive ROS otherwise negative.   Objective:  BP 113/68   Pulse 84   Ht 5' 4" (1.626 m)   Wt 107 lb (48.5 kg)   LMP 09/16/2017   BMI 18.37 kg/m  CONSTITUTIONAL: Well-developed, well-nourished female in no acute distress.  HENT:  Normocephalic, atraumatic, External right and left ear normal. Oropharynx is clear and moist EYES: Conjunctivae and EOM are normal. Pupils are equal, round, and reactive to light. No scleral icterus.  NECK: Normal range of motion, supple, no masses.  Normal thyroid.   CARDIOVASCULAR: Normal heart rate noted, regular rhythm RESPIRATORY: Clear to auscultation  bilaterally. Effort and breath sounds normal, no problems with respiration noted. BREASTS: Symmetric in size. No masses, skin changes, nipple drainage, or lymphadenopathy. ABDOMEN: Soft, normal bowel sounds, no distention noted.  No tenderness, rebound or guarding.  PELVIC: Normal appearing external genitalia; normal appearing vaginal mucosa and cervix.  No abnormal discharge noted.  Pap smear obtained.  Normal uterine size, no other palpable masses, no uterine or adnexal tenderness. MUSCULOSKELETAL: Normal range of motion. No tenderness.  No cyanosis, clubbing, or edema.  2+ distal pulses. SKIN: Skin is warm and dry. No rash noted. Not diaphoretic. No erythema. No pallor. NEUROLOGIC: Alert and oriented to person, place, and time. Normal reflexes, muscle tone coordination. No cranial nerve deficit noted. PSYCHIATRIC: Normal mood and affect. Normal behavior. Normal judgment and thought content.  Hysteroscopy: Consent signed. Paracervical block placed with 10mL lidocaine with epi. Endosee hysteroscope primed with sterile water. Hysteroscope placed through cervix and the uterine cavity was filled with water. Anterio/fundal polyp seen with small amount of bleeding. Hysteroscope removed. Pt tolerated procedure well.   Assessment:  Annual gynecologic examination with pap smear   Plan:  1. Well Woman Exam Will follow up results of pap smear and manage accordingly. Mammogram scheduled  2. Abnormal uterine bleeding (AUB) In office hysteroscopy done - appears like endometrial polyp. D/w Dr Arnold - will arrange hysteroscopy with polypectomy at surgery center. - Cytology - PAP   Routine preventative health maintenance measures emphasized. Please refer to After Visit Summary for other counseling recommendations.    Jacob Stinson, DO [facility]  

## 2017-09-17 ENCOUNTER — Encounter (HOSPITAL_COMMUNITY): Payer: Self-pay

## 2017-09-20 ENCOUNTER — Other Ambulatory Visit: Payer: Self-pay

## 2017-09-20 ENCOUNTER — Encounter (HOSPITAL_COMMUNITY): Payer: Self-pay | Admitting: *Deleted

## 2017-09-20 LAB — CYTOLOGY - PAP
Diagnosis: NEGATIVE
HPV: NOT DETECTED

## 2017-09-22 ENCOUNTER — Encounter: Payer: Self-pay | Admitting: *Deleted

## 2017-09-23 ENCOUNTER — Ambulatory Visit (HOSPITAL_COMMUNITY): Payer: Self-pay | Admitting: Anesthesiology

## 2017-09-23 ENCOUNTER — Other Ambulatory Visit: Payer: Self-pay

## 2017-09-23 ENCOUNTER — Ambulatory Visit (HOSPITAL_COMMUNITY)
Admission: RE | Admit: 2017-09-23 | Discharge: 2017-09-23 | Disposition: A | Discharge status: Home / Self Care | Payer: Self-pay | Source: Ambulatory Visit | Attending: Obstetrics & Gynecology | Admitting: Obstetrics & Gynecology

## 2017-09-23 ENCOUNTER — Encounter (HOSPITAL_COMMUNITY): Payer: Self-pay | Admitting: *Deleted

## 2017-09-23 ENCOUNTER — Encounter (HOSPITAL_COMMUNITY)
Admission: RE | Disposition: A | Discharge status: Home / Self Care | Payer: Self-pay | Source: Ambulatory Visit | Attending: Obstetrics & Gynecology

## 2017-09-23 DIAGNOSIS — F17210 Nicotine dependence, cigarettes, uncomplicated: Secondary | ICD-10-CM | POA: Insufficient documentation

## 2017-09-23 DIAGNOSIS — D649 Anemia, unspecified: Secondary | ICD-10-CM | POA: Insufficient documentation

## 2017-09-23 DIAGNOSIS — N920 Excessive and frequent menstruation with regular cycle: Secondary | ICD-10-CM | POA: Diagnosis present

## 2017-09-23 DIAGNOSIS — N939 Abnormal uterine and vaginal bleeding, unspecified: Secondary | ICD-10-CM | POA: Insufficient documentation

## 2017-09-23 HISTORY — DX: Presence of dental prosthetic device (complete) (partial): Z97.2

## 2017-09-23 HISTORY — DX: Disease of blood and blood-forming organs, unspecified: D75.9

## 2017-09-23 HISTORY — PX: HYSTEROSCOPY W/D&C: SHX1775

## 2017-09-23 HISTORY — DX: Personal history of other medical treatment: Z92.89

## 2017-09-23 HISTORY — DX: Other specified abnormal uterine and vaginal bleeding: N93.8

## 2017-09-23 HISTORY — DX: Gastro-esophageal reflux disease without esophagitis: K21.9

## 2017-09-23 HISTORY — DX: Personal history of urinary calculi: Z87.442

## 2017-09-23 SURGERY — DILATATION AND CURETTAGE /HYSTEROSCOPY
Anesthesia: General

## 2017-09-23 MED ORDER — LACTATED RINGERS IV SOLN
INTRAVENOUS | Status: DC
  Administered 2017-09-23 (×2): via INTRAVENOUS

## 2017-09-23 MED ORDER — KETOROLAC TROMETHAMINE 30 MG/ML IJ SOLN
INTRAMUSCULAR | Status: AC
  Filled 2017-09-23: qty 1

## 2017-09-23 MED ORDER — BUPIVACAINE-EPINEPHRINE 0.5% -1:200000 IJ SOLN: INTRAMUSCULAR | Status: DC | PRN

## 2017-09-23 MED ORDER — OXYCODONE-ACETAMINOPHEN 5-325 MG PO TABS: 1.0000 | ORAL_TABLET | Freq: Four times a day (QID) | ORAL | 0 refills | Status: DC | PRN

## 2017-09-23 MED ORDER — PROPOFOL 10 MG/ML IV BOLUS
INTRAVENOUS | Status: DC | PRN
  Administered 2017-09-23: 120 mg via INTRAVENOUS

## 2017-09-23 MED ORDER — FENTANYL CITRATE (PF) 100 MCG/2ML IJ SOLN
INTRAMUSCULAR | Status: DC | PRN
  Administered 2017-09-23: 25 ug via INTRAVENOUS

## 2017-09-23 MED ORDER — PROPOFOL 10 MG/ML IV BOLUS
INTRAVENOUS | Status: AC
  Filled 2017-09-23: qty 20

## 2017-09-23 MED ORDER — ONDANSETRON HCL 4 MG/2ML IJ SOLN
INTRAMUSCULAR | Status: DC | PRN
  Administered 2017-09-23: 4 mg via INTRAVENOUS

## 2017-09-23 MED ORDER — FENTANYL CITRATE (PF) 100 MCG/2ML IJ SOLN: 25.0000 ug | INTRAMUSCULAR | Status: DC | PRN

## 2017-09-23 MED ORDER — DEXAMETHASONE SODIUM PHOSPHATE 4 MG/ML IJ SOLN
INTRAMUSCULAR | Status: AC
  Filled 2017-09-23: qty 1

## 2017-09-23 MED ORDER — ACETAMINOPHEN 325 MG PO TABS: 325.0000 mg | ORAL_TABLET | ORAL | Status: DC | PRN

## 2017-09-23 MED ORDER — LIDOCAINE HCL (CARDIAC) PF 100 MG/5ML IV SOSY
PREFILLED_SYRINGE | INTRAVENOUS | Status: DC | PRN
  Administered 2017-09-23: 50 mg via INTRAVENOUS

## 2017-09-23 MED ORDER — FENTANYL CITRATE (PF) 100 MCG/2ML IJ SOLN
INTRAMUSCULAR | Status: AC
  Filled 2017-09-23: qty 2

## 2017-09-23 MED ORDER — MEPERIDINE HCL 25 MG/ML IJ SOLN: 6.2500 mg | INTRAMUSCULAR | Status: DC | PRN

## 2017-09-23 MED ORDER — DEXAMETHASONE SODIUM PHOSPHATE 10 MG/ML IJ SOLN
INTRAMUSCULAR | Status: DC | PRN
  Administered 2017-09-23: 4 mg via INTRAVENOUS

## 2017-09-23 MED ORDER — ACETAMINOPHEN 160 MG/5ML PO SOLN: 325.0000 mg | ORAL | Status: DC | PRN

## 2017-09-23 MED ORDER — ONDANSETRON HCL 4 MG/2ML IJ SOLN: 4.0000 mg | Freq: Once | INTRAMUSCULAR | Status: DC | PRN

## 2017-09-23 MED ORDER — OXYCODONE HCL 5 MG/5ML PO SOLN: 5.0000 mg | Freq: Once | ORAL | Status: DC | PRN

## 2017-09-23 MED ORDER — SCOPOLAMINE 1 MG/3DAYS TD PT72
1.0000 | MEDICATED_PATCH | Freq: Once | TRANSDERMAL | Status: DC
  Administered 2017-09-23: 1.5 mg via TRANSDERMAL

## 2017-09-23 MED ORDER — ONDANSETRON HCL 4 MG/2ML IJ SOLN
INTRAMUSCULAR | Status: AC
  Filled 2017-09-23: qty 2

## 2017-09-23 MED ORDER — OXYCODONE HCL 5 MG PO TABS: 5.0000 mg | ORAL_TABLET | Freq: Once | ORAL | Status: DC | PRN

## 2017-09-23 MED ORDER — SODIUM CHLORIDE 0.9 % IR SOLN
Status: DC | PRN
  Administered 2017-09-23: 3000 mL

## 2017-09-23 MED ORDER — SCOPOLAMINE 1 MG/3DAYS TD PT72
MEDICATED_PATCH | TRANSDERMAL | Status: AC
  Administered 2017-09-23: 1.5 mg via TRANSDERMAL
  Filled 2017-09-23: qty 1

## 2017-09-23 MED ORDER — BUPIVACAINE HCL (PF) 0.5 % IJ SOLN
INTRAMUSCULAR | Status: DC | PRN
  Administered 2017-09-23: 6 mL

## 2017-09-23 MED ORDER — LIDOCAINE HCL (CARDIAC) PF 100 MG/5ML IV SOSY
PREFILLED_SYRINGE | INTRAVENOUS | Status: AC
  Filled 2017-09-23: qty 5

## 2017-09-23 SURGICAL SUPPLY — 15 items
CANISTER SUCT 3000ML PPV (MISCELLANEOUS) ×3 IMPLANT
CATH ROBINSON RED A/P 16FR (CATHETERS) ×3 IMPLANT
ELECT REM PT RETURN 9FT ADLT (ELECTROSURGICAL) ×3
ELECTRODE REM PT RTRN 9FT ADLT (ELECTROSURGICAL) ×1 IMPLANT
GLOVE BIO SURGEON STRL SZ 6.5 (GLOVE) ×2 IMPLANT
GLOVE BIO SURGEONS STRL SZ 6.5 (GLOVE) ×1
GLOVE BIOGEL PI IND STRL 7.0 (GLOVE) ×2 IMPLANT
GLOVE BIOGEL PI INDICATOR 7.0 (GLOVE) ×4
GOWN STRL REUS W/TWL LRG LVL3 (GOWN DISPOSABLE) ×6 IMPLANT
PACK VAGINAL MINOR WOMEN LF (CUSTOM PROCEDURE TRAY) ×3 IMPLANT
PAD OB MATERNITY 4.3X12.25 (PERSONAL CARE ITEMS) ×3 IMPLANT
SEAL ROD LENS SCOPE MYOSURE (ABLATOR) ×3 IMPLANT
TOWEL OR 17X24 6PK STRL BLUE (TOWEL DISPOSABLE) ×6 IMPLANT
TUBING AQUILEX INFLOW (TUBING) ×3 IMPLANT
TUBING AQUILEX OUTFLOW (TUBING) ×3 IMPLANT

## 2017-09-23 NOTE — Discharge Instructions (Signed)
Abnormal Uterine Bleeding Abnormal uterine bleeding means bleeding more than usual from your uterus. It can include:  Bleeding between periods.  Bleeding after sex.  Bleeding that is heavier than normal.  Periods that last longer than usual.  Bleeding after you have stopped having your period (menopause).  There are many problems that may cause this. You should see a doctor for any kind of bleeding that is not normal. Treatment depends on the cause of the bleeding. Follow these instructions at home:  Watch your condition for any changes.  Do not use tampons, douche, or have sex, if your doctor tells you not to.  Change your pads often.  Get regular well-woman exams. Make sure they include a pelvic exam and cervical cancer screening.  Keep all follow-up visits as told by your doctor. This is important. Contact a doctor if:  The bleeding lasts more than one week.  You feel dizzy at times.  You feel like you are going to throw up (nauseous).  You throw up. Get help right away if:  You pass out.  You have to change pads every hour.  You have belly (abdominal) pain.  You have a fever.  You get sweaty.  You get weak.  You passing large blood clots from your vagina. Summary  Abnormal uterine bleeding means bleeding more than usual from your uterus.  There are many problems that may cause this. You should see a doctor for any kind of bleeding that is not normal.  Treatment depends on the cause of the bleeding. This information is not intended to replace advice given to you by your health care provider. Make sure you discuss any questions you have with your health care provider. Document Released: 10/19/2008 Document Revised: 12/17/2015 Document Reviewed: 12/17/2015 Elsevier Interactive Patient Education  2017 [employer] Hysteroscopy, Care After Refer to this sheet in the next few weeks. These instructions provide you with information on caring for yourself  after your procedure. Your health care provider may also give you more specific instructions. Your treatment has been planned according to current medical practices, but problems sometimes occur. Call your health care provider if you have any problems or questions after your procedure. What can I expect after the procedure? After your procedure, it is typical to have the following:  You may have some cramping. This normally lasts for a couple days.  You may have bleeding. This can vary from light spotting for a few days to menstrual-like bleeding for 3-7 days.  Follow these instructions at home:  Rest for the first 1-2 days after the procedure.  Only take over-the-counter or prescription medicines as directed by your health care provider. Do not take aspirin. It can increase the chances of bleeding.  Take showers instead of baths for 2 weeks or as directed by your health care provider.  Do not drive for 24 hours or as directed.  Do not drink alcohol while taking pain medicine.  Do not use tampons, douche, or have sexual intercourse for 2 weeks or until your health care provider says it is okay.  Take your temperature twice a day for 4-5 days. Write it down each time.  Follow your health care provider's advice about diet, exercise, and lifting.  If you develop constipation, you may: ? Take a mild laxative if your health care provider approves. ? Add bran foods to your diet. ? Drink enough fluids to keep your urine clear or pale yellow.  Try to have someone with you or  available to you for the first 24-48 hours, especially if you were given a general anesthetic.  Follow up with your health care provider as directed. Contact a health care provider if:  You feel dizzy or lightheaded.  You feel sick to your stomach (nauseous).  You have abnormal vaginal discharge.  You have a rash.  You have pain that is not controlled with medicine. Get help right away if:  You have  bleeding that is heavier than a normal menstrual period.  You have a fever.  You have increasing cramps or pain, not controlled with medicine.  You have new belly (abdominal) pain.  You pass out.  You have pain in the tops of your shoulders (shoulder strap areas).  You have shortness of breath. This information is not intended to replace advice given to you by your health care provider. Make sure you discuss any questions you have with your health care provider. Document Released: 10/12/2012 Document Revised: 05/30/2015 Document Reviewed: 07/21/2012 Elsevier Interactive Patient Education  2017 [employer] Inc[employer]    Post Anesthesia Home Care Instructions  Activity: Get plenty of rest for the remainder of the day. A responsible individual must stay with you for 24 hours following the procedure.  For the next 24 hours, DO NOT: -Drive a car -Advertising copywriter -Drink alcoholic beverages -Take any medication unless instructed by your physician -Make any legal decisions or sign important papers.  Meals: Start with liquid foods such as gelatin or soup. Progress to regular foods as tolerated. Avoid greasy, spicy, heavy foods. If nausea and/or vomiting occur, drink only clear liquids until the nausea and/or vomiting subsides. Call your physician if vomiting continues.  Special Instructions/Symptoms: Your throat may feel dry or sore from the anesthesia or the breathing tube placed in your throat during surgery. If this causes discomfort, gargle with warm salt water. The discomfort should disappear within 24 hours.  If you had a scopolamine patch placed behind your ear for the management of post- operative nausea and/or vomiting:  1. The medication in the patch is effective for 72 hours, after which it should be removed.  Wrap patch in a tissue and discard in the trash. Wash hands thoroughly with soap and water. 2. You may remove the patch earlier than 72 hours if you experience unpleasant  side effects which may include dry mouth, dizziness or visual disturbances. 3. Avoid touching the patch. Wash your hands with soap and water after contact with the patch.

## 2017-09-23 NOTE — Op Note (Signed)
PREOPERATIVE DIAGNOSIS:  Abnormal uterine bleeding POSTOPERATIVE DIAGNOSIS: The same PROCEDURE: Hysteroscopy, Dilation and Curettage. SURGEON:  Dr. Scheryl DarterJames Juanjesus Pepperman   INDICATIONS: 42 y.o. [id number]  here for scheduled surgery for the aforementioned diagnoses.   Risks of surgery were discussed with the patient including but not limited to: bleeding which may require transfusion; infection which may require antibiotics; injury to uterus or surrounding organs; intrauterine scarring which may impair future fertility; need for additional procedures including laparotomy or laparoscopy; and other postoperative/anesthesia complications. Written informed consent was obtained.    FINDINGS:  A 6 week size uterus.  Diffuse proliferative endometrium.  Normal ostia bilaterally.  ANESTHESIA:   General, paracervical block with 6 ml of 0.5% Marcaine INTRAVENOUS FLUIDS:  1000 ml of LR FLUID DEFICITS:  75ml of saline ESTIMATED BLOOD LOSS:  Less than 20 ml SPECIMENS: Endometrial curettings sent to pathology COMPLICATIONS:  None immediate.  PROCEDURE DETAILS:  The patient received intravenous antibiotics while in the preoperative area.  She was then taken to the operating room where general anesthesia was administered and was found to be adequate.  After an adequate timeout was performed, she was placed in the dorsal lithotomy position and examined; then prepped and draped in the sterile manner.   Her bladder was catheterized for an unmeasured amount of clear, yellow urine. A speculum was then placed in the patient's vagina and a single tooth tenaculum was applied to the anterior lip of the cervix.   A paracervical block using 6 ml of 0.5% Marcaine was administered.  The cervix was sounded to 8 cm and dilated manually with metal dilators to accommodate the 5 mm diagnostic hysteroscope.  Once the cervix was dilated, the hysteroscope was inserted under video visualization using saline as a suspension medium.  The uterine cavity  was carefully examined with the findings as noted above.   After further careful visualization of the uterine cavity, the hysteroscope was removed under direct visualization.  No discrete polyps were seen. A sharp curettage was then performed to obtain a moderate amount of endometrial curettings.  The tenaculum was removed from the anterior lip of the cervix and the vaginal speculum was removed after noting good hemostasis.  The patient tolerated the procedure well and was taken to the recovery area awake, extubated and in stable condition.  The patient will be discharged to home as per PACU criteria.  Routine postoperative instructions given.  She was prescribed Percocet and ibuprofen.  She will follow up in the clinic in 2-3 weeks  for postoperative evaluation.   Adam PhenixArnold, Nychelle Cassata G, MD 09/23/2017 2:28 PM

## 2017-09-23 NOTE — Anesthesia Postprocedure Evaluation (Signed)
Anesthesia Post Note  Patient: Sheila Bell  Procedure(s) Performed: DILATATION AND CURETTAGE /HYSTEROSCOPY (N/A )     Patient location during evaluation: PACU Anesthesia Type: General Level of consciousness: awake and alert Pain management: pain level controlled Vital Signs Assessment: post-procedure vital signs reviewed and stable Respiratory status: spontaneous breathing, nonlabored ventilation, respiratory function stable and patient connected to nasal cannula oxygen Cardiovascular status: blood pressure returned to baseline and stable Postop Assessment: no apparent nausea or vomiting Anesthetic complications: no    Last Vitals:  Vitals:   09/23/17 1500 09/23/17 1515  BP: 105/65 104/64  Pulse: 67 76  Resp: 10 19  Temp:    SpO2: 100% 100%    Last Pain:  Vitals:   09/23/17 1515  TempSrc:   PainSc: 0-No pain   Pain Goal: Patients Stated Pain Goal: 5 (09/23/17 1500)               Foxx Klarich

## 2017-09-23 NOTE — Transfer of Care (Signed)
Immediate Anesthesia Transfer of Care Note  Patient: Sheila Bell  Procedure(s) Performed: DILATATION AND CURETTAGE /HYSTEROSCOPY (N/A )  Patient Location: PACU  Anesthesia Type:General  Level of Consciousness: awake, alert  and sedated  Airway & Oxygen Therapy: Patient Spontanous Breathing and Patient connected to nasal cannula oxygen  Post-op Assessment: Report given to RN and Post -op Vital signs reviewed and stable  Post vital signs: Reviewed and stable  Last Vitals:  Vitals Value Taken Time  BP    Temp    Pulse 61 09/23/2017  2:42 PM  Resp 12 09/23/2017  2:42 PM  SpO2 100 % 09/23/2017  2:42 PM  Vitals shown include unvalidated device data.  Last Pain:  Vitals:   09/23/17 1258  TempSrc: Oral  PainSc: 0-No pain      Patients Stated Pain Goal: 5 (09/23/17 1258)  Complications: No apparent anesthesia complications

## 2017-09-23 NOTE — Anesthesia Preprocedure Evaluation (Signed)
Anesthesia Evaluation  Patient identified by MRN, date of birth, ID band Patient awake    Reviewed: Allergy & Precautions, H&P , NPO status , Patient's Chart, lab work & pertinent test results, reviewed documented beta blocker date and time   Airway Mallampati: II  TM Distance: >3 FB Neck ROM: full    Dental no notable dental hx.    Pulmonary neg pulmonary ROS, Current Smoker,    Pulmonary exam normal breath sounds clear to auscultation       Cardiovascular Exercise Tolerance: Good negative cardio ROS   Rhythm:regular Rate:Normal     Neuro/Psych negative neurological ROS  negative psych ROS   GI/Hepatic negative GI ROS, Neg liver ROS,   Endo/Other  negative endocrine ROS  Renal/GU negative Renal ROS  negative genitourinary   Musculoskeletal   Abdominal   Peds  Hematology negative hematology ROS (+)   Anesthesia Other Findings   Reproductive/Obstetrics negative OB ROS                             Anesthesia Physical Anesthesia Plan  ASA: II  Anesthesia Plan: General   Post-op Pain Management:    Induction: Intravenous  PONV Risk Score and Plan: 3 and Dexamethasone, Ondansetron, Treatment may vary due to age or medical condition and Scopolamine patch - Pre-op  Airway Management Planned: LMA  Additional Equipment:   Intra-op Plan:   Post-operative Plan: Extubation in OR  Informed Consent: I have reviewed the patients History and Physical, chart, labs and discussed the procedure including the risks, benefits and alternatives for the proposed anesthesia with the patient or authorized representative who has indicated his/her understanding and acceptance.   Dental Advisory Given  Plan Discussed with: CRNA, Anesthesiologist and Surgeon  Anesthesia Plan Comments: ( )        Anesthesia Quick Evaluation

## 2017-09-23 NOTE — Progress Notes (Signed)
Per Dr. Tacy Duraddono, no labs needed in SS.  Patient labs in Epic from 08/16/17.  Verified verbal order, no labs needed in SS..Marland Kitchen

## 2017-09-23 NOTE — Anesthesia Procedure Notes (Signed)
Procedure Name: LMA Insertion Date/Time: 09/23/2017 2:08 PM Performed by: Cleda ClarksBrowder, Nhung Danko R, CRNA Pre-anesthesia Checklist: Patient identified, Emergency Drugs available, Suction available, Patient being monitored and Timeout performed Patient Re-evaluated:Patient Re-evaluated prior to induction Oxygen Delivery Method: Circle system utilized Preoxygenation: Pre-oxygenation with 100% oxygen Induction Type: IV induction LMA: LMA inserted LMA Size: 3.0 Number of attempts: 1 Airway Equipment and Method: Patient positioned with wedge pillow Tube secured with: Tape

## 2017-09-23 NOTE — Interval H&P Note (Signed)
History and Physical Interval Note:  09/23/2017 12:58 PM  Sheila SitesJennifer E Donati  has presented today for surgery, with the diagnosis of endometrial polyp  The various methods of treatment have been discussed with the patient and family. After consideration of risks, benefits and other options for treatment, the patient has consented to  Procedure(s): DILATATION AND CURETTAGE /HYSTEROSCOPY (N/A) resection of endometrial polyp as a surgical intervention .  The patient's history has been reviewed, patient examined, no change in status, stable for surgery.  I have reviewed the patient's chart and labs.  Questions were answered to the patient's satisfaction.     Scheryl DarterJames Anette Barra

## 2017-09-24 ENCOUNTER — Encounter (HOSPITAL_COMMUNITY): Payer: Self-pay | Admitting: Obstetrics & Gynecology

## 2017-09-27 ENCOUNTER — Other Ambulatory Visit: Payer: Self-pay | Admitting: Family Medicine

## 2017-09-27 DIAGNOSIS — K219 Gastro-esophageal reflux disease without esophagitis: Secondary | ICD-10-CM

## 2017-09-28 ENCOUNTER — Telehealth: Payer: Self-pay | Admitting: General Practice

## 2017-09-28 NOTE — Telephone Encounter (Signed)
Patient called and left message on nurse voicemail line stating she is a patient of Dr Adrian BlackwaterStinson and needs a refill on her prilosec. Per chart review, refill has already been prescribed. Called & informed patient. Patient verbalized understanding & had no questions.

## 2017-10-06 ENCOUNTER — Telehealth: Payer: Self-pay | Admitting: Nurse Practitioner

## 2017-10-06 NOTE — Telephone Encounter (Signed)
Pt called to inform her PCP that she now has financial assistance and would like a referral to GI and an order for a [place] on left arm. Please follow up

## 2017-10-12 ENCOUNTER — Other Ambulatory Visit: Payer: Self-pay | Admitting: Nurse Practitioner

## 2017-10-12 DIAGNOSIS — R7989 Other specified abnormal findings of blood chemistry: Secondary | ICD-10-CM

## 2017-10-12 DIAGNOSIS — R945 Abnormal results of liver function studies: Principal | ICD-10-CM

## 2017-10-12 DIAGNOSIS — R2232 Localized swelling, mass and lump, left upper limb: Secondary | ICD-10-CM

## 2017-10-12 NOTE — Telephone Encounter (Signed)
Referral has been placed and [place] has been ordered.

## 2017-10-12 NOTE — Telephone Encounter (Signed)
CMA spoke to patient and inform her referral has been placed and Ultrasound date.  Pt. Understood. Pt. Verified DOB.

## 2017-10-14 ENCOUNTER — Ambulatory Visit (HOSPITAL_COMMUNITY)
Admission: RE | Admit: 2017-10-14 | Discharge: 2017-10-14 | Disposition: A | Discharge status: Home / Self Care | Payer: Self-pay | Source: Ambulatory Visit | Attending: Nurse Practitioner | Admitting: Nurse Practitioner

## 2017-10-14 DIAGNOSIS — R2232 Localized swelling, mass and lump, left upper limb: Secondary | ICD-10-CM | POA: Insufficient documentation

## 2017-10-18 ENCOUNTER — Ambulatory Visit: Payer: Self-pay | Attending: Nurse Practitioner | Admitting: Nurse Practitioner

## 2017-10-18 ENCOUNTER — Encounter: Payer: Self-pay | Admitting: Nurse Practitioner

## 2017-10-18 VITALS — BP 98/63 | HR 98 | Temp 98.1°F | Resp 14 | Ht 64.0 in | Wt 106.6 lb

## 2017-10-18 DIAGNOSIS — D508 Other iron deficiency anemias: Secondary | ICD-10-CM

## 2017-10-18 DIAGNOSIS — Z87442 Personal history of urinary calculi: Secondary | ICD-10-CM | POA: Insufficient documentation

## 2017-10-18 DIAGNOSIS — Z0000 Encounter for general adult medical examination without abnormal findings: Secondary | ICD-10-CM

## 2017-10-18 DIAGNOSIS — Z79891 Long term (current) use of opiate analgesic: Secondary | ICD-10-CM | POA: Insufficient documentation

## 2017-10-18 DIAGNOSIS — Z791 Long term (current) use of non-steroidal anti-inflammatories (NSAID): Secondary | ICD-10-CM | POA: Insufficient documentation

## 2017-10-18 DIAGNOSIS — K219 Gastro-esophageal reflux disease without esophagitis: Secondary | ICD-10-CM

## 2017-10-18 DIAGNOSIS — Z0001 Encounter for general adult medical examination with abnormal findings: Secondary | ICD-10-CM | POA: Insufficient documentation

## 2017-10-18 DIAGNOSIS — Z79899 Other long term (current) drug therapy: Secondary | ICD-10-CM | POA: Insufficient documentation

## 2017-10-18 DIAGNOSIS — Z9851 Tubal ligation status: Secondary | ICD-10-CM | POA: Insufficient documentation

## 2017-10-18 DIAGNOSIS — Z8249 Family history of ischemic heart disease and other diseases of the circulatory system: Secondary | ICD-10-CM | POA: Insufficient documentation

## 2017-10-18 DIAGNOSIS — Z8719 Personal history of other diseases of the digestive system: Secondary | ICD-10-CM | POA: Insufficient documentation

## 2017-10-18 MED ORDER — FERROUS SULFATE 325 (65 FE) MG PO TBEC: 325.0000 mg | DELAYED_RELEASE_TABLET | Freq: Two times a day (BID) | ORAL | 11 refills | Status: DC

## 2017-10-18 MED ORDER — OMEPRAZOLE 20 MG PO CPDR: 20.0000 mg | DELAYED_RELEASE_CAPSULE | Freq: Every day | ORAL | 2 refills | Status: DC

## 2017-10-18 MED FILL — ?OMEPRazole 20mg CPDR: 20 | 30 days supply | Qty: 30 | Fill #1

## 2017-10-18 NOTE — Patient Instructions (Signed)

## 2017-10-18 NOTE — Progress Notes (Signed)
Assessment & Plan:  Adalene was seen today for annual exam.  Diagnoses and all orders for this visit:  Well woman exam without gynecological exam  Gastroesophageal reflux disease, esophagitis presence not specified -     omeprazole (PRILOSEC) 20 MG capsule; Take 1 capsule (20 mg total) by mouth at bedtime.  Other iron deficiency anemia -     ferrous sulfate 325 (65 FE) MG EC tablet; Take 1 tablet (325 mg total) by mouth 2 (two) times daily.    Patient has been counseled on age-appropriate routine health concerns for screening and prevention. These are reviewed and up-to-date. Referrals have been placed accordingly. Immunizations are up-to-date or declined.    Subjective:   Chief Complaint  Patient presents with  . Annual Exam   HPI WHITTNEY STEENSON 42 y.o. female presents to office today for well woman exam. Her PAP is not due until 2022. She is accompanied by her mother today.   Review of Systems  Constitutional: Negative.  Negative for chills, fever, malaise/fatigue and weight loss.  HENT: Negative.  Negative for congestion, hearing loss, sinus pain and sore throat.   Eyes: Negative.  Negative for blurred vision, double vision, photophobia and pain.  Respiratory: Negative.  Negative for cough, sputum production, shortness of breath and wheezing.   Cardiovascular: Negative.  Negative for chest pain and leg swelling.  Gastrointestinal: Positive for heartburn. Negative for abdominal pain, constipation, diarrhea, nausea and vomiting.  Genitourinary: Negative.   Musculoskeletal: Negative.  Negative for joint pain and myalgias.  Skin: Negative.  Negative for rash.  Neurological: Negative.  Negative for dizziness, tremors, speech change, focal weakness, seizures and headaches.  Endo/Heme/Allergies: Negative.  Negative for environmental allergies.  Psychiatric/Behavioral: Negative.  Negative for depression and suicidal ideas. The patient is not nervous/anxious and does not have  insomnia.     Past Medical History:  Diagnosis Date  . Anemia   . Blood dyscrasia    bleeds easy  . DUB (dysfunctional uterine bleeding)    polyp  . GERD (gastroesophageal reflux disease)   . History of blood transfusion 07/2017   [facility]  . History of kidney stones    in [place]  . History of stomach ulcers   . SVD (spontaneous vaginal delivery)    x 2  . Wears dentures    upper and lower    Past Surgical History:  Procedure Laterality Date  . HYSTEROSCOPY W/D&C N/A 09/23/2017   Procedure: DILATATION AND CURETTAGE /HYSTEROSCOPY;  Surgeon: Adam Phenix, MD;  Location: [facility];  Service: Gynecology;  Laterality: N/A;  . TUBAL LIGATION      Family History  Problem Relation Age of Onset  . Hypertension Father     Social History Reviewed with no changes to be made today.   Outpatient Medications Prior to Visit  Medication Sig Dispense Refill  . ferrous sulfate 325 (65 FE) MG EC tablet Take 1 tablet (325 mg total) by mouth 2 (two) times daily. 60 tablet 11  . omeprazole (PRILOSEC) 20 MG capsule Take 1 capsule (20 mg total) by mouth at bedtime. 90 capsule 2  . naproxen sodium (ALEVE) 220 MG tablet Take 220-440 mg by mouth daily as needed (for pain or headache).    . oxyCODONE-acetaminophen (PERCOCET/ROXICET) 5-325 MG tablet Take 1-2 tablets by mouth every 6 (six) hours as needed. (Patient not taking: Reported on 10/18/2017) 12 tablet 0   No facility-administered medications prior to visit.     No Known Allergies  Objective:    BP 98/63 (BP Location: Left Arm, Patient Position: Sitting, Cuff Size: Normal)   Pulse 98   Temp 98.1 F (36.7 C) (Oral)   Resp 14   Ht 5\' 4"  (1.626 m)   Wt 106 lb 9.6 oz (48.4 kg)   SpO2 100%   BMI 18.30 kg/m  Wt Readings from Last 3 Encounters:  10/18/17 106 lb 9.6 oz (48.4 kg)  09/20/17 107 lb (48.5 kg)  09/16/17 107 lb (48.5 kg)    Physical Exam  Constitutional: She is oriented to person, place, and time. She appears  well-developed and well-nourished.  HENT:  Head: Normocephalic and atraumatic.  Right Ear: Hearing, tympanic membrane, external ear and ear canal normal.  Left Ear: Hearing, external ear and ear canal normal. A middle ear effusion is present.  Nose: Mucosal edema present. No rhinorrhea. Right sinus exhibits no maxillary sinus tenderness and no frontal sinus tenderness. Left sinus exhibits no maxillary sinus tenderness and no frontal sinus tenderness.  Mouth/Throat: Uvula is midline, oropharynx is clear and moist and mucous membranes are normal. She has dentures (full edentulous). Abnormal dentition. No oropharyngeal exudate. Tonsils are 2+ on the right. Tonsils are 2+ on the left.  Eyes: Pupils are equal, round, and reactive to light. Conjunctivae and EOM are normal. Right eye exhibits no discharge. No scleral icterus.  Neck: Normal range of motion and full passive range of motion without pain. Neck supple. No tracheal deviation present. No thyroid mass and no thyromegaly present.  Cardiovascular: Normal rate, regular rhythm, normal heart sounds and intact distal pulses. Exam reveals no friction rub.  No murmur heard. Pulmonary/Chest: Effort normal and breath sounds normal. No accessory muscle usage. No respiratory distress. She has no decreased breath sounds. She has no wheezes. She has no rhonchi. She has no rales. She exhibits no tenderness. Right breast exhibits no inverted nipple, no mass, no nipple discharge, no skin change and no tenderness. Left breast exhibits no inverted nipple, no mass, no nipple discharge, no skin change and no tenderness. No breast swelling, tenderness, discharge or bleeding. Breasts are symmetrical.  Abdominal: Bowel sounds are normal. She exhibits no distension and no mass. There is no tenderness. There is no rebound and no guarding.  Genitourinary:  Genitourinary Comments: DEFERRED  Musculoskeletal: Normal range of motion. She exhibits no edema, tenderness or  deformity.  Lymphadenopathy:    She has no cervical adenopathy.  Neurological: She is alert and oriented to person, place, and time. She has normal strength and normal reflexes. No cranial nerve deficit or sensory deficit. She displays a negative Romberg sign. Coordination and gait normal.  Reflex Scores:      Patellar reflexes are 2+ on the right side and 2+ on the left side. Skin: Skin is warm and dry. No erythema.  Psychiatric: She has a normal mood and affect. Her speech is normal and behavior is normal. Judgment and thought content normal. Cognition and memory are normal.       Patient has been counseled extensively about nutrition and exercise as well as the importance of adherence with medications and regular follow-up. The patient was given clear instructions to go to ER or return to medical center if symptoms don't improve, worsen or new problems develop. The patient verbalized understanding.   Follow-up: Return in about 6 months (around 04/19/2018), or if symptoms worsen or fail to improve.   Claiborne Rigg, FNP-BC [facility] [place], [place] [phone]   10/18/2017, 4:48  PM

## 2017-10-18 NOTE — Progress Notes (Signed)
Pt states she has no c/o. She does want a flu shot.

## 2017-10-25 ENCOUNTER — Encounter: Payer: Self-pay | Admitting: Obstetrics & Gynecology

## 2017-10-25 ENCOUNTER — Ambulatory Visit (INDEPENDENT_AMBULATORY_CARE_PROVIDER_SITE_OTHER): Payer: Self-pay | Admitting: Obstetrics & Gynecology

## 2017-10-25 VITALS — BP 108/54 | HR 89 | Ht 64.0 in | Wt 105.4 lb

## 2017-10-25 DIAGNOSIS — N920 Excessive and frequent menstruation with regular cycle: Secondary | ICD-10-CM

## 2017-10-25 DIAGNOSIS — Z9889 Other specified postprocedural states: Secondary | ICD-10-CM

## 2017-10-25 NOTE — Progress Notes (Signed)
Subjective:4 weeks postop     Sheila Bell is a 42 y.o. female who presents to the clinic 4 weeks status post diagnostic hysteroscopy and D&C for abnormal uterine bleeding. Eating a regular diet without difficulty. Bowel movements are normal. The patient is not having any pain.  The following portions of the patient's history were reviewed and updated as appropriate: allergies, current medications, past family history, past medical history, past social history, past surgical history and problem list.  Review of Systems Genitourinary:positive for menstrual cramps    Objective:    BP (!) 108/54   Pulse 89   Ht 5\' 4"  (1.626 m)   Wt 105 lb 6.4 oz (47.8 kg)   LMP 10/17/2017 (Approximate)   BMI 18.09 kg/m  General:  alert, cooperative and no distress  Abdomen: non-tender  Incision:   n/a     Assessment:    Doing well postoperatively. Operative findings again reviewed. Pathology report discussed.    Plan:    1. Continue any current medications.  3. Activity restrictions: none 4. Anticipated return to work: not applicable. 5. Follow up: 6 months.she can use Aleve for menstrual relief for first 2-3 days of her cycle. Path was benign polyps  Adam Phenix, MD 10/25/2017   Patient ID: Sheila Bell, female   DOB: 06/14/1975, 42 y.o.   MRN: [id number]

## 2017-10-25 NOTE — Patient Instructions (Signed)

## 2017-11-01 ENCOUNTER — Other Ambulatory Visit (HOSPITAL_COMMUNITY): Payer: Self-pay | Admitting: *Deleted

## 2017-11-01 DIAGNOSIS — Z1231 Encounter for screening mammogram for malignant neoplasm of breast: Secondary | ICD-10-CM

## 2017-11-15 ENCOUNTER — Encounter: Payer: Self-pay | Admitting: Nurse Practitioner

## 2017-12-17 MED FILL — OMEPRAZOLE 20 MG CAP: 20 | 30 days supply | Qty: 30 | Fill #2

## 2018-01-11 ENCOUNTER — Encounter: Payer: Self-pay | Admitting: Nurse Practitioner

## 2018-01-20 ENCOUNTER — Encounter: Payer: Self-pay | Admitting: Gastroenterology

## 2018-01-25 ENCOUNTER — Encounter (HOSPITAL_COMMUNITY): Payer: Self-pay

## 2018-01-25 ENCOUNTER — Ambulatory Visit (HOSPITAL_COMMUNITY)
Admission: RE | Admit: 2018-01-25 | Discharge: 2018-01-25 | Disposition: A | Discharge status: Home / Self Care | Payer: Medicaid Other | Source: Ambulatory Visit | Attending: Obstetrics and Gynecology | Admitting: Obstetrics and Gynecology

## 2018-01-25 ENCOUNTER — Ambulatory Visit: Payer: Medicaid Other

## 2018-01-25 ENCOUNTER — Other Ambulatory Visit (HOSPITAL_COMMUNITY): Payer: Self-pay | Admitting: Obstetrics and Gynecology

## 2018-01-25 VITALS — BP 90/60 | Ht 64.0 in | Wt 105.0 lb

## 2018-01-25 DIAGNOSIS — N63 Unspecified lump in unspecified breast: Secondary | ICD-10-CM

## 2018-01-25 DIAGNOSIS — N6310 Unspecified lump in the right breast, unspecified quadrant: Secondary | ICD-10-CM

## 2018-01-25 DIAGNOSIS — N6452 Nipple discharge: Secondary | ICD-10-CM

## 2018-01-25 DIAGNOSIS — Z1239 Encounter for other screening for malignant neoplasm of breast: Secondary | ICD-10-CM

## 2018-01-25 DIAGNOSIS — N6315 Unspecified lump in the right breast, overlapping quadrants: Secondary | ICD-10-CM

## 2018-01-25 NOTE — Patient Instructions (Signed)
Explained breast self awareness with Sheila Bell. Patient did not need a Pap smear today due to last Pap smear and HPV typing was 09/06/2017. Let her know BCCCP will cover Pap smears and HPV typing every 5 years unless has a history of abnormal Pap smears. Referred patient to the [facility] for a diagnostic mammogram bilateral breast ultrasound. Appointment scheduled for Monday, January 31, 2018 at 1900. Patient aware of appointment and will be there. Let patient know will follow up with her within the next couple weeks with results of breast discharge by phone. Discussed smoking cessation with patient. Referred to the [facility] Quitline and gave resources to the free smoking cessation classes at [facility]. Sheila Bell verbalized understanding.  Sheila Bell, Sheila Maser, RN 3:47 PM

## 2018-01-25 NOTE — Progress Notes (Signed)
Complaints of bilateral clear nipple discharge that is spontaneous at times x 44 years. Patient has never had a mammogram completed.  Pap Smear: Pap smear not completed today. Last Pap smear was 09/06/2017 at the [facility] and normal with negative HPV. Per patient has no history of an abnormal Pap smear. Last Pap smear result is in Epic.  Physical exam: Breasts Breasts symmetrical. No skin abnormalities bilateral breasts. No nipple retraction bilateral breasts. Bilateral clear/yellowish colored nipple discharge expressed on exam. Sample of bilateral breast discharge sent to Cytology for evaluation. No lymphadenopathy. No lumps palpated left breast. Palpated a bb sized mobile lump within the right breast at 10 o'clock 3 cm from the nipple. No complaints of pain or tenderness on exam. Referred patient to the [facility] for a diagnostic mammogram bilateral breast ultrasound. Appointment scheduled for Monday, January 31, 2018 at 1900.        Pelvic/Bimanual No Pap smear completed today since last Pap smear and HPV typing was 09/06/2017. Pap smear not indicated per BCCCP guidelines.   Smoking History: Patient is a current smoker. Discussed smoking cessation with patient. Referred to the [facility] Quitline and gave resources to the free smoking cessation classes at [facility].  Patient Navigation: Patient education provided. Access to services provided for patient through BCCCP program.   Breast and Cervical Cancer Risk Assessment: Patient has no family history of breast cancer, known genetic mutations, or radiation treatment to the chest before age 80. Patient has no history of cervical dysplasia, immunocompromised, or DES exposure in-utero.  Risk Assessment    Risk Scores      01/25/2018   Last edited by: Priscille Heidelberg, RN   5-year risk: 0.5 %   Lifetime risk: 7.1 %

## 2018-01-26 ENCOUNTER — Encounter (HOSPITAL_COMMUNITY): Payer: Self-pay | Admitting: *Deleted

## 2018-01-31 ENCOUNTER — Ambulatory Visit
Admission: RE | Admit: 2018-01-31 | Discharge: 2018-01-31 | Disposition: A | Discharge status: Home / Self Care | Payer: No Typology Code available for payment source | Source: Ambulatory Visit | Attending: Obstetrics and Gynecology | Admitting: Obstetrics and Gynecology

## 2018-01-31 ENCOUNTER — Ambulatory Visit
Admission: RE | Admit: 2018-01-31 | Discharge: 2018-01-31 | Disposition: A | Discharge status: Home / Self Care | Payer: Medicaid Other | Source: Ambulatory Visit | Attending: Obstetrics and Gynecology | Admitting: Obstetrics and Gynecology

## 2018-01-31 ENCOUNTER — Telehealth (HOSPITAL_COMMUNITY): Payer: Self-pay | Admitting: *Deleted

## 2018-01-31 DIAGNOSIS — N6452 Nipple discharge: Secondary | ICD-10-CM

## 2018-01-31 DIAGNOSIS — N63 Unspecified lump in unspecified breast: Secondary | ICD-10-CM

## 2018-01-31 NOTE — Telephone Encounter (Signed)
Telephoned patient at mobile number and was unable to leave message

## 2018-02-01 MED FILL — ?OMEPRAZOLE 20 MG CAPSULE D: 20 | 90 days supply | Qty: 90 | Fill #0

## 2018-02-03 ENCOUNTER — Ambulatory Visit (INDEPENDENT_AMBULATORY_CARE_PROVIDER_SITE_OTHER): Payer: Self-pay | Admitting: Gastroenterology

## 2018-02-03 ENCOUNTER — Encounter: Payer: Self-pay | Admitting: Gastroenterology

## 2018-02-03 VITALS — BP 108/56 | HR 65 | Ht 64.0 in | Wt 103.6 lb

## 2018-02-03 DIAGNOSIS — D649 Anemia, unspecified: Secondary | ICD-10-CM

## 2018-02-03 DIAGNOSIS — R748 Abnormal levels of other serum enzymes: Secondary | ICD-10-CM

## 2018-02-03 NOTE — Patient Instructions (Signed)
Follow up as needed. You should have a colonoscopy at age 43.

## 2018-02-03 NOTE — Progress Notes (Signed)
Referring Provider: Gildardo Pounds, NP Primary Care Physician:  Gildardo Pounds, NP   Reason for Consultation: Abnormal liver enzymes  IMPRESSION:  Abnormal liver enzymes, now resolved Abnormal abdominal ultrasound    - GB wall thickening and trace pericholecystic/periphepatic ascites GERD controlled on daily PPI Hospitalization for anemia due to abnormal uterine bleeding No known family history of colon cancer or polyps  Recent abnormal transaminases that have now normalized.  Abnormal ultrasound findings without associated clinical symptoms. Both occurred during hospitalization for symptomatic anemia. Hypotense on admission. No symptoms to support symptomatic biliary disease.   GERD has been well controlled on PPI therapy. Discussed trial off omeprazole. Taper discussed.   PLAN: Screening for hepatitis C recommended given her history of cocaine use Colonoscopy at age 64 Return PRN   HPI: Sheila Bell is a 43 y.o. female seen in consultation at the request of NP Raul Del. The history is obtained through the patient and review of her electronic health record.  Recent hysteroscopy and D&C for abnormal uterine bleeding with associated symptomatic anemia requiring hospitalization and transfusion of 2 units of PRCBS. She was hypotense and tachycardic on admission in July. Hemoglobin was 6.9.  Found to have elevated liver enzymes during that hospitalization that were further evaluated with an ultrasound. Ultrasound showed some gallbladder changes. Liver enzymes have subsequently normalized on their own.  She was tolerating a regular diet and had no associated symptoms.   She has a long-standing history of GERD controlled on daily omeprazole. No dysphagia, odynophagia, regurgitation, or nausea.   Labs from 07/23/17 showed AST 91, ALT 70, alk phos 221, TB 1.3.  Labs from 08/16/17 showed AST 22, ALT 11, alk phos 61, TB 0.2.   No liver enzymes are available to me prior to  July. The patient denies any known history of liver disease. No family history of liver disease.   I have personally reviewed the images from her ultrasound 07/22/17: Gallbladder wall is thickened to 4 mm and there is pericholecystic and perihepatic fluid. No cholelithiasis. Negative sonographic Murphy's sign. Common bile duct: Diameter: 4.2 mm. Liver: No focal liver lesion. Normal liver echogenicity. Portal vein is patent on color Doppler imaging with normal direction of blood flow towards the liver. Visualized spleen and pancreas appeared normal.   No overt GI blood loss. No melena, hematochezia, bright red blood per rectum. No epistaxis, hemoptysis, or hematuria.   Past Medical History:  Diagnosis Date  . Anemia   . Blood dyscrasia    bleeds easy  . DUB (dysfunctional uterine bleeding)    polyp  . GERD (gastroesophageal reflux disease)   . History of blood transfusion 07/2017   [facility]  . History of kidney stones    in [place]  . History of stomach ulcers   . SVD (spontaneous vaginal delivery)    x 2  . Wears dentures    upper and lower    Past Surgical History:  Procedure Laterality Date  . HYSTEROSCOPY W/D&C N/A 09/23/2017   Procedure: DILATATION AND CURETTAGE /HYSTEROSCOPY;  Surgeon: Woodroe Mode, MD;  Location: [facility];  Service: Gynecology;  Laterality: N/A;  . TUBAL LIGATION      Current Outpatient Medications  Medication Sig Dispense Refill  . ferrous sulfate 325 (65 FE) MG EC tablet Take 1 tablet (325 mg total) by mouth 2 (two) times daily. 60 tablet 11  . omeprazole (PRILOSEC) 20 MG capsule Take 1 capsule (20 mg total) by mouth at bedtime. 90 capsule 2  No current facility-administered medications for this visit.     Allergies as of 02/03/2018  . (No Known Allergies)    Family History  Problem Relation Age of Onset  . Hypertension Father     Social History   Socioeconomic History  . Marital status: Divorced    Spouse name: Not on file  . Number of  children: 2  . Years of education: Not on file  . Highest education level: GED or equivalent  Occupational History  . Not on file  Social Needs  . Financial resource strain: Not on file  . Food insecurity:    Worry: Not on file    Inability: Not on file  . Transportation needs:    Medical: No    Non-medical: No  Tobacco Use  . Smoking status: Current Every Day Smoker    Packs/day: 1.00    Years: 18.00    Pack years: 18.00    Types: Cigarettes  . Smokeless tobacco: Never Used  Substance and Sexual Activity  . Alcohol use: No  . Drug use: No  . Sexual activity: Yes    Birth control/protection: Surgical, Condom  Lifestyle  . Physical activity:    Days per week: Not on file    Minutes per session: Not on file  . Stress: Not on file  Relationships  . Social connections:    Talks on phone: Not on file    Gets together: Not on file    Attends religious service: Not on file    Active member of club or organization: Not on file    Attends meetings of clubs or organizations: Not on file    Relationship status: Not on file  . Intimate partner violence:    Fear of current or ex partner: Not on file    Emotionally abused: Not on file    Physically abused: Not on file    Forced sexual activity: Not on file  Other Topics Concern  . Not on file  Social History Narrative  . Not on file    Review of Systems: 12 system ROS is negative except as noted above.  Filed Weights   02/03/18 1532  Weight: 103 lb 9.6 oz (47 kg)    Physical Exam: Vital signs were reviewed. General:   Alert, well-nourished, petite, pleasant and cooperative in NAD Head:  Normocephalic and atraumatic. Eyes:  Sclera clear, no icterus.   Conjunctiva pink. Mouth:  No deformity or lesions.   Neck:  Supple; no thyromegaly. Lungs:  Clear throughout to auscultation.   No wheezes.  Heart:  Regular rate and rhythm; no murmurs Abdomen:  Soft, thin, nontender, normal bowel sounds. No rebound or guarding. No  hepatosplenomegaly Rectal:  Deferred  Msk:  Symmetrical without gross deformities. Extremities:  No gross deformities or edema. Neurologic:  Alert and  oriented x4;  grossly nonfocal Skin:  No rash or bruise. Psych:  Alert and cooperative. Normal mood and affect.   Dhanvin Szeto L. Tarri Glenn, MD, MPH [facility] 02/19/2018, 6:45 AM

## 2018-02-19 ENCOUNTER — Encounter: Payer: Self-pay | Admitting: Gastroenterology

## 2019-12-11 IMAGING — DX DG CHEST 2V
2 series · 2 of 2 positions shown · non-contrast
Comparison: None.

CLINICAL DATA: 42-year-old female with cough.

EXAM:
CHEST - 2 VIEW

[chest lat]
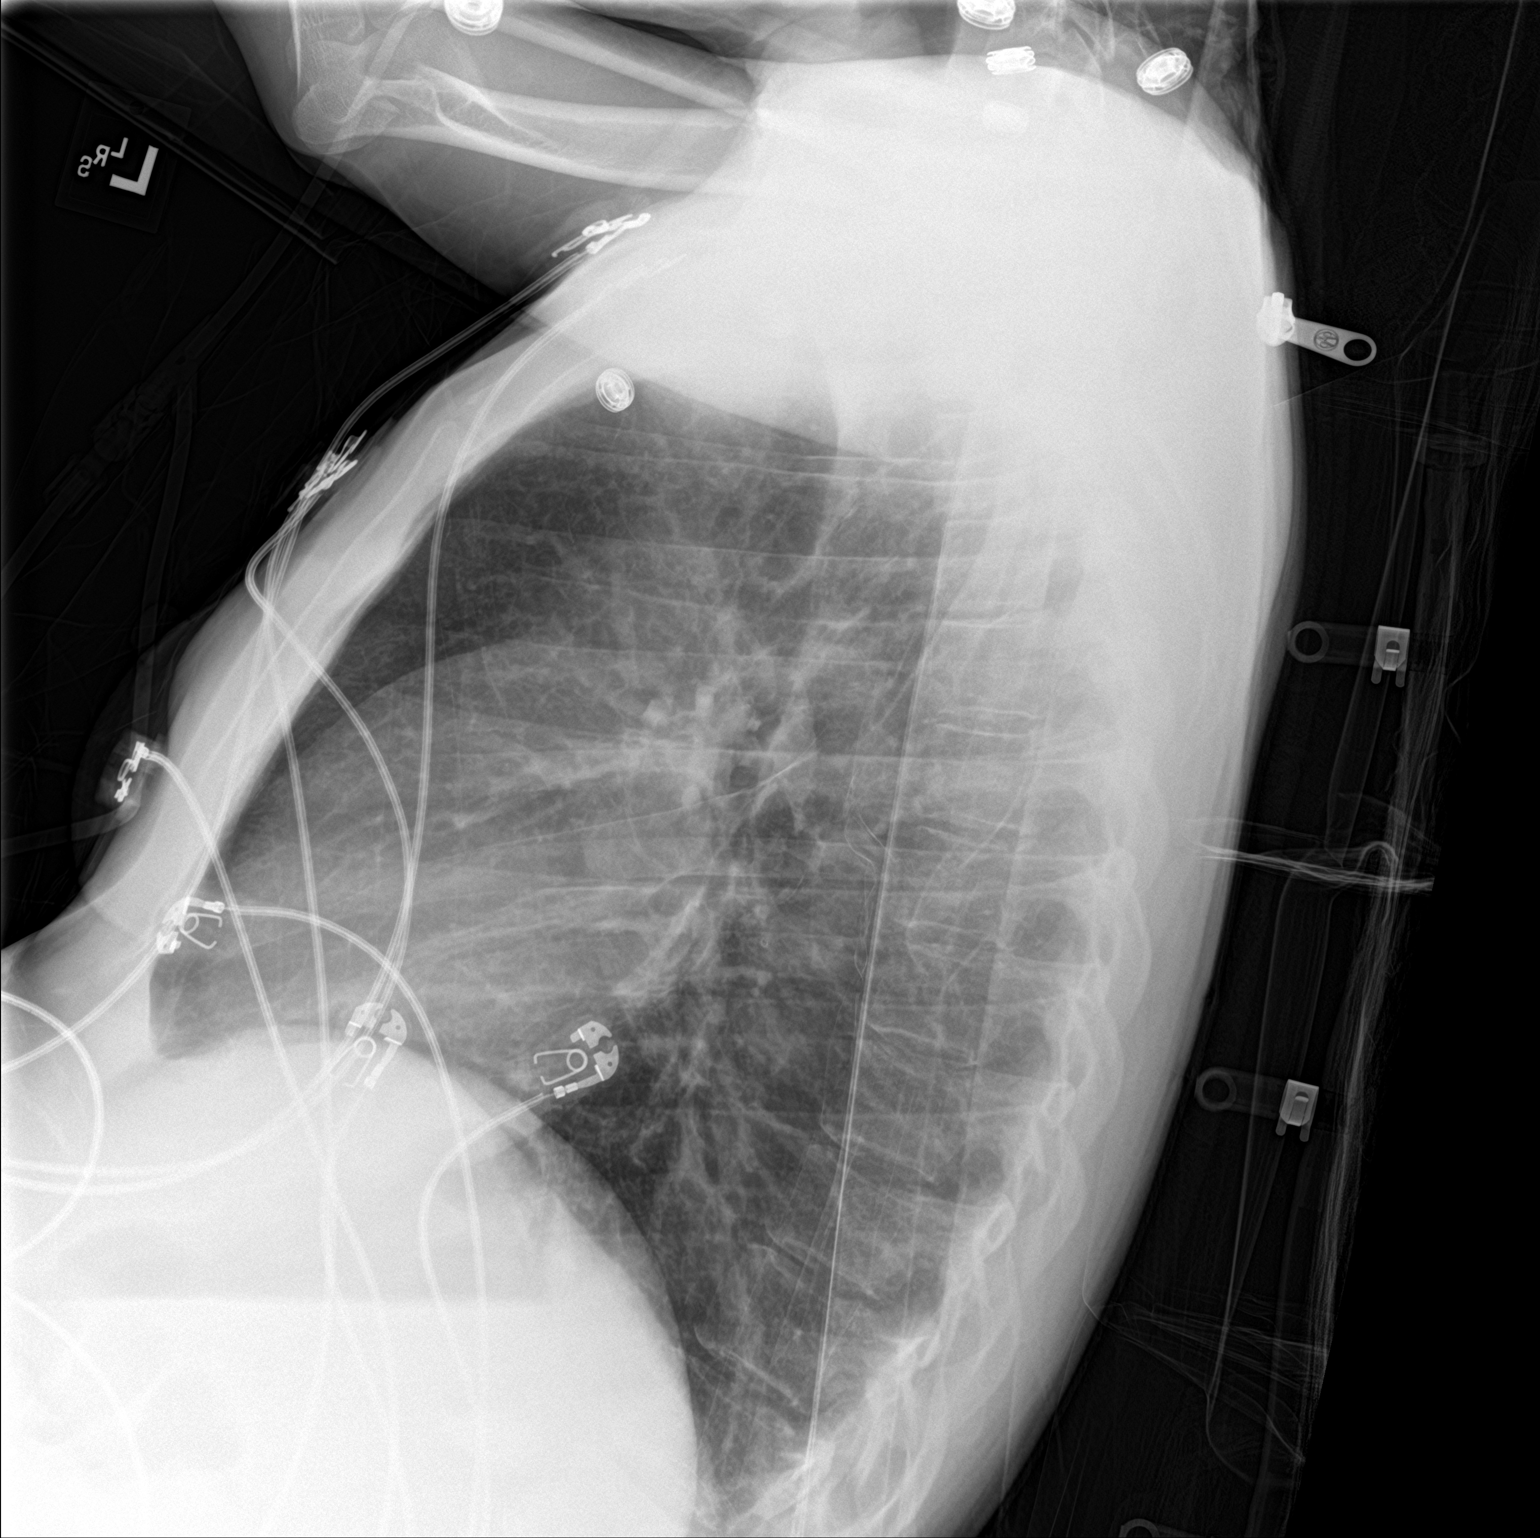

[chest ap]
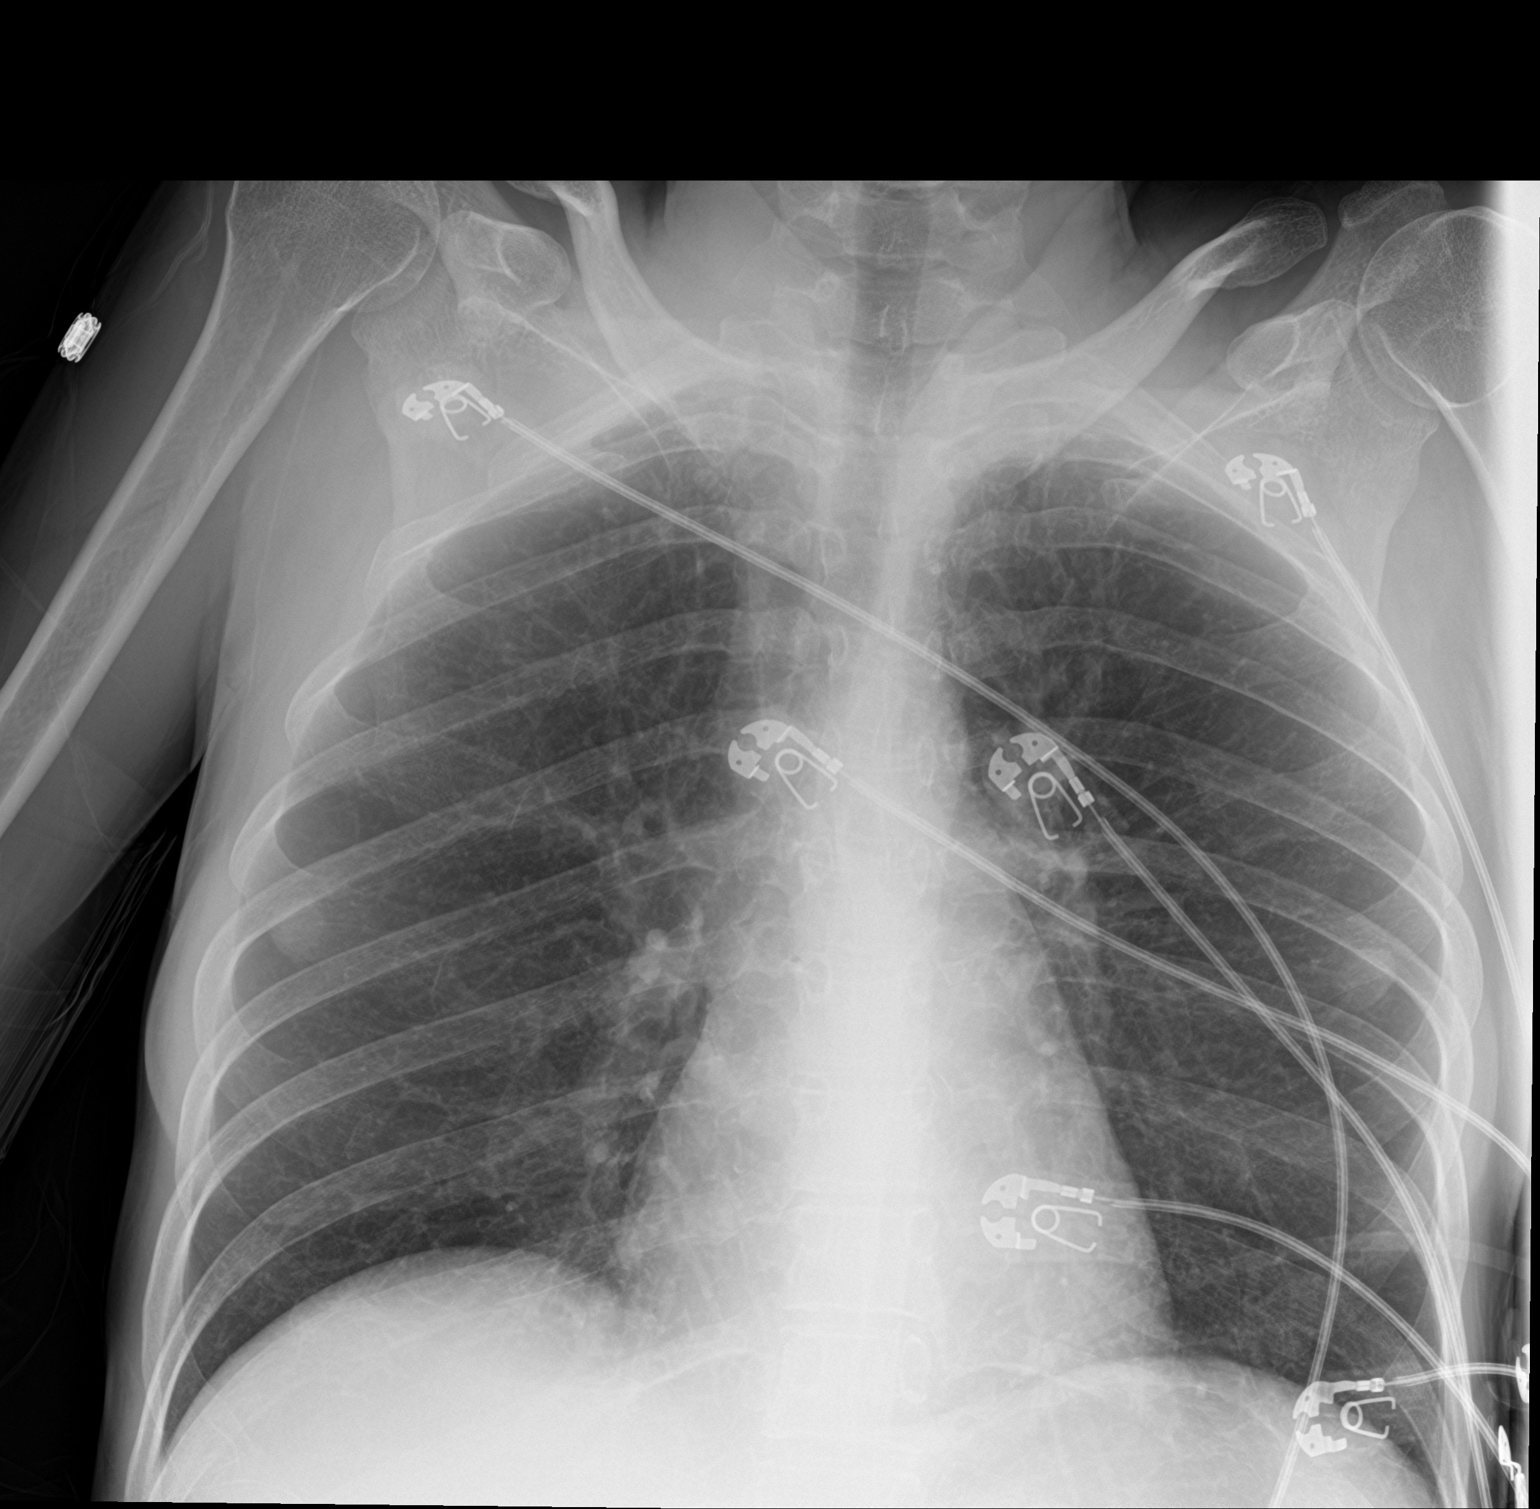

[2 of 2 positions shown; findings below may reference images not displayed]

FINDINGS: The lungs are clear. There is no pleural effusion or pneumothorax.
The cardiac silhouette is within normal limits. Bilateral nipple
shadows noted. No acute osseous pathology.
IMPRESSION: No active cardiopulmonary disease.

## 2020-01-16 IMAGING — US US ABDOMEN COMPLETE
1 series · 13 of 25 positions shown · non-contrast
Comparison: None.

CLINICAL DATA: 42 y/o F; right upper quadrant abdominal pain
tonight.

EXAM:
ABDOMEN ULTRASOUND COMPLETE

[Series 1: us abdomen complete · 0.23mm/px · 13 of 95 slices shown]
[im 1/95]
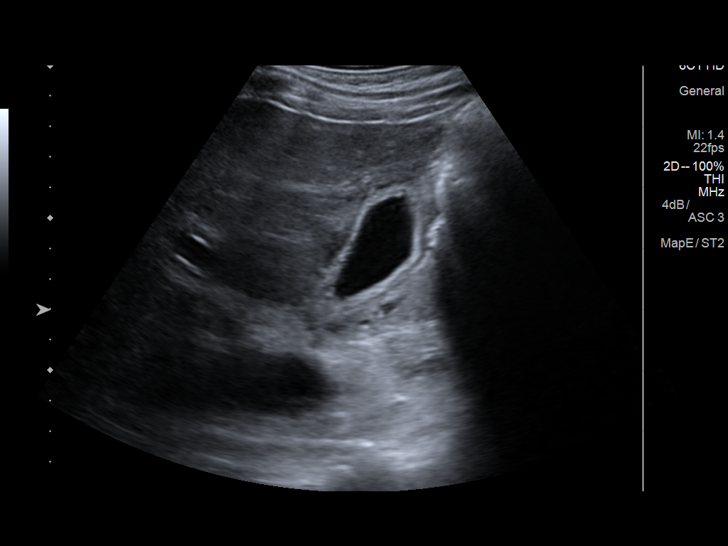
[im 8/95]
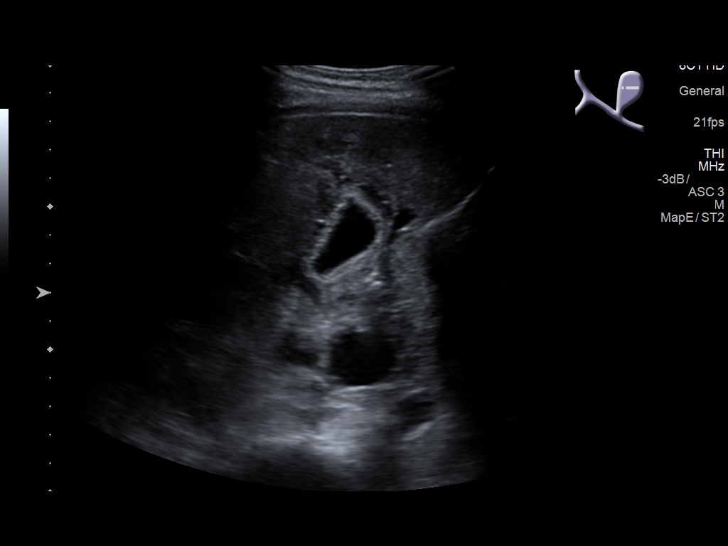
[im 16/95]
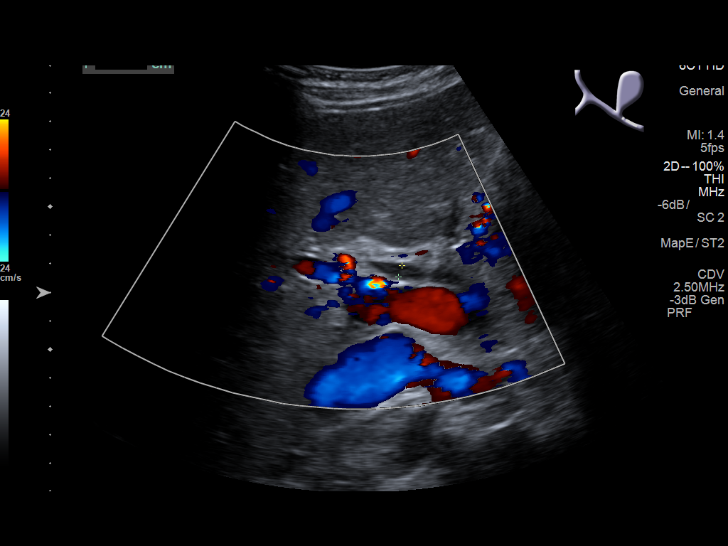
[im 24/95]
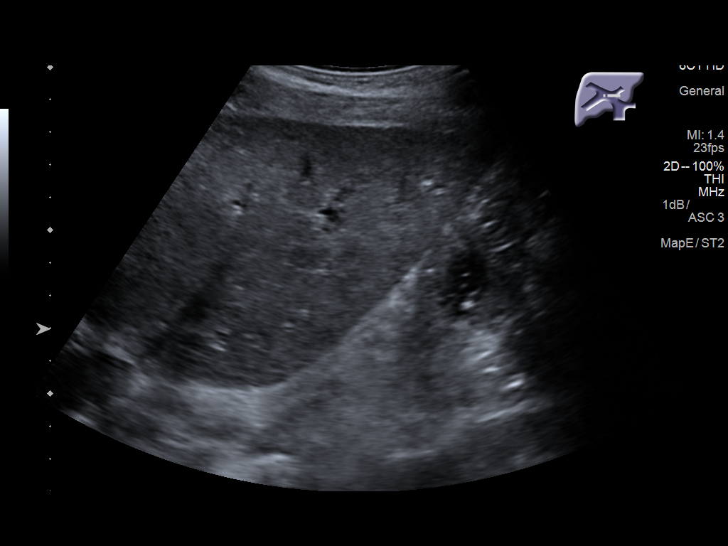
[im 32/95]
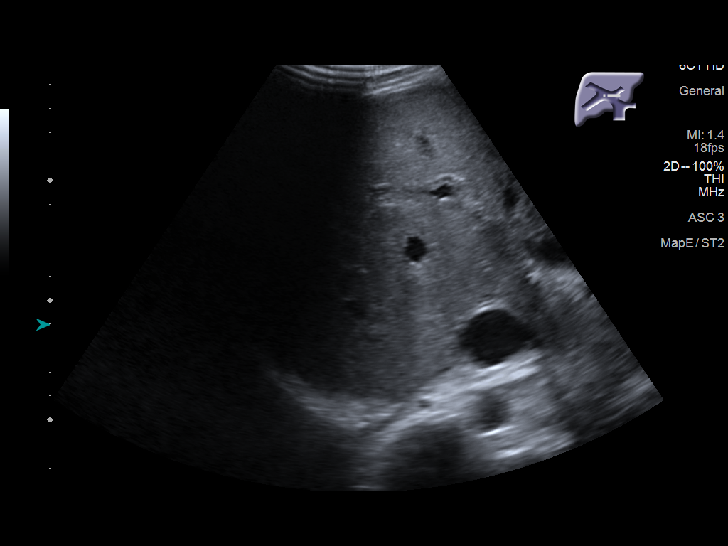
[im 40/95]
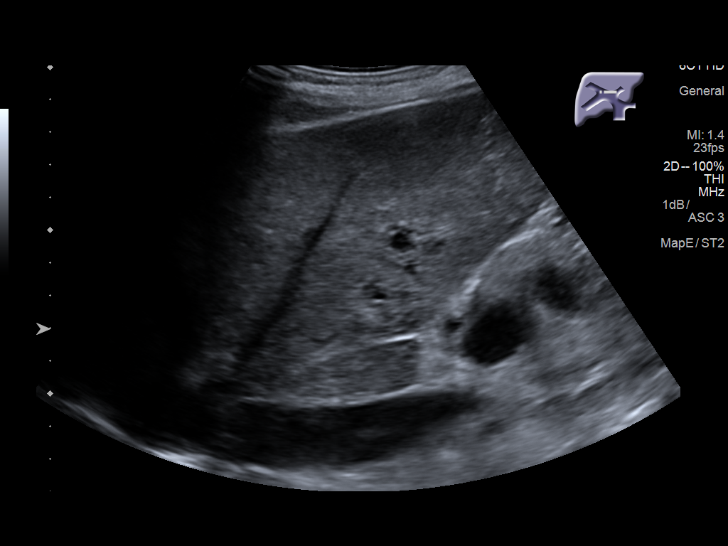
[im 48/95]
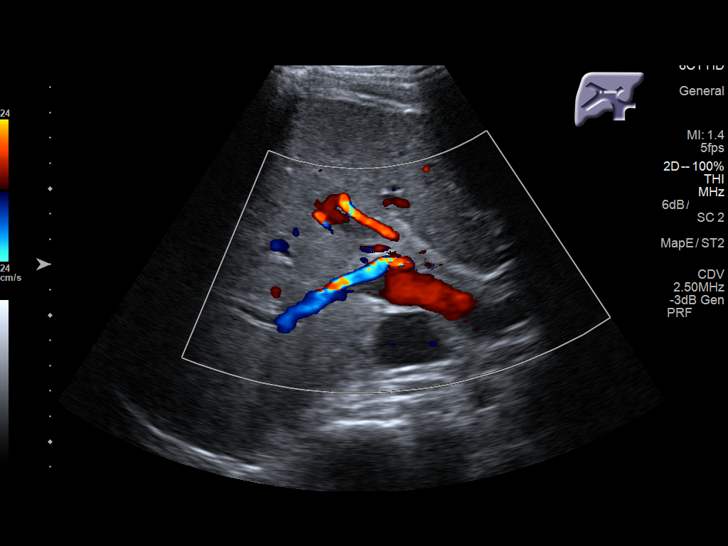
[im 55/95]
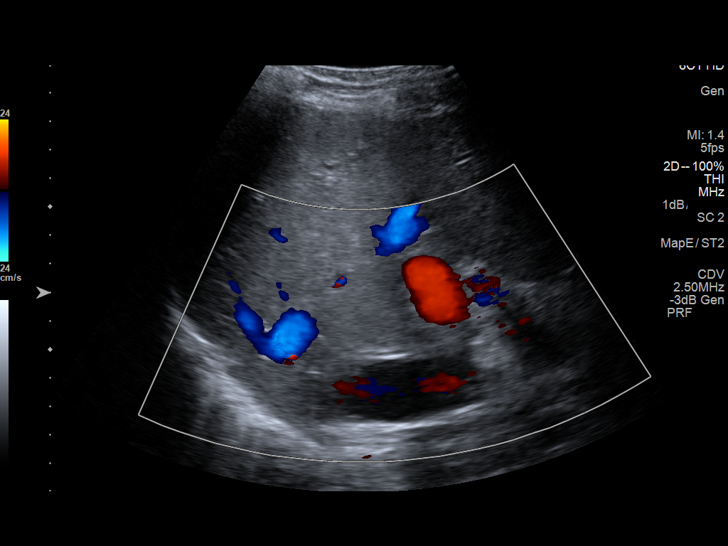
[im 63/95]
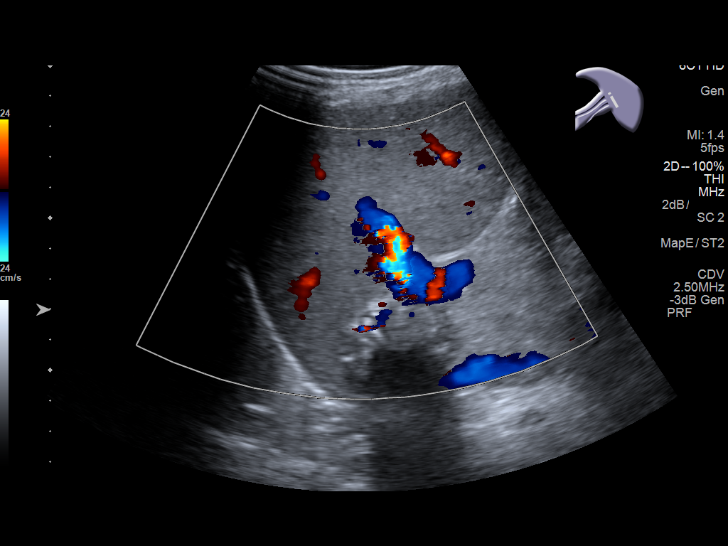
[im 71/95]
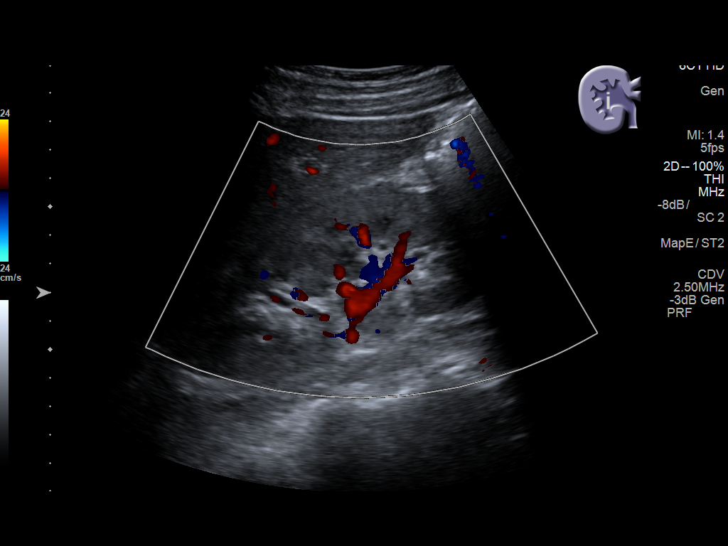
[im 79/95]
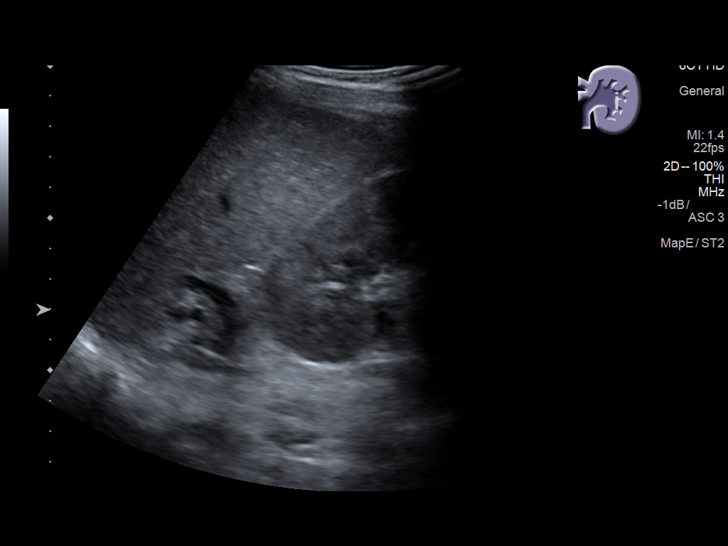
[im 87/95]
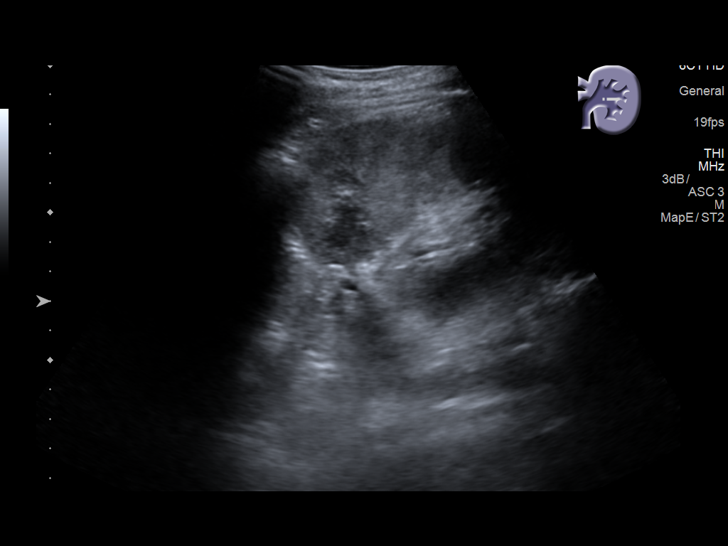
[im 95/95]
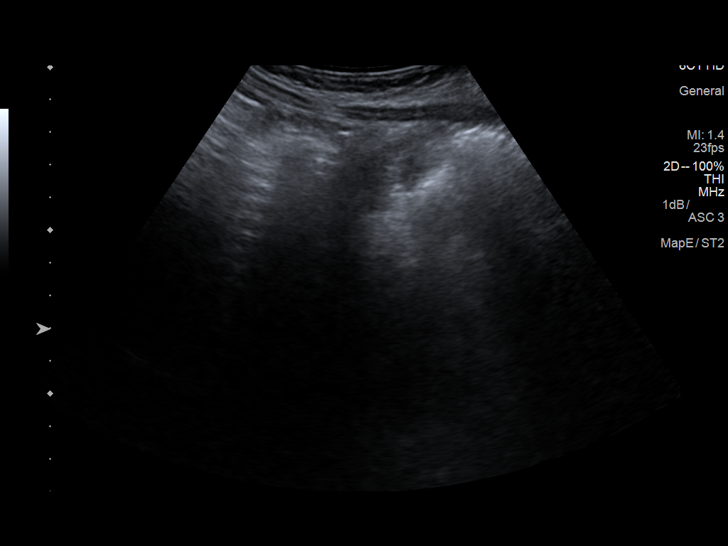

[13 of 25 positions shown; findings below may reference images not displayed]

FINDINGS: Gallbladder: Gallbladder wall is thickened to 4 mm and there is
pericholecystic and perihepatic fluid. No cholelithiasis. Negative
sonographic Murphy's sign.

Common bile duct: Diameter: 4.2 mm

Liver: No focal liver lesion. Normal liver echogenicity. Portal vein
is patent on color Doppler imaging with normal direction of blood
flow towards the liver.

IVC: No abnormality visualized.

Pancreas: Visualized portion unremarkable.

Spleen: Size and appearance within normal limits.

Right Kidney: Length: 12.4 cm. Echogenicity within normal limits. No
mass or hydronephrosis visualized.

Left Kidney: Length: 12.4 cm. Echogenicity within normal limits. No
mass or hydronephrosis visualized.

Abdominal aorta: No aneurysm visualized.

Other findings: Trace perihepatic ascites.
IMPRESSION: Gallbladder wall thickening and trace pericholecystic/perihepatic
ascites. No gallstone. No biliary ductal dilatation. Findings are
nonspecific and may represent cholecystitis, reactive changes due to
local inflammation, heart failure, or hypoproteinemia.

By: Schuh Multhaup M.D.

## 2020-06-22 IMAGING — US ULTRASOUND LEFT BREAST LIMITED
1 series · 4 of 4 positions shown · non-contrast
Comparison: None available

CLINICAL DATA: 42-year-old patient recently evaluated by the [REDACTED]. A nodule was palpated in the 10 o'clock position the right
breast 3 cm from nipple. The patient reports a history of bilateral
spontaneous and expressed discharge bilaterally for approximately 20
years. She says the amount of discharge is very small, and often
presents As small amounts of crusting material on the nipples. She
has never had bloody nipple discharge. She does not palpate a lump.

EXAM:
DIGITAL DIAGNOSTIC BILATERAL MAMMOGRAM WITH CAD AND TOMO
ULTRASOUND BILATERAL BREAST

[Series 1: ultrasound left breast limited · 0.06mm/px · 4 of 4 slices shown]
[im 1/4]
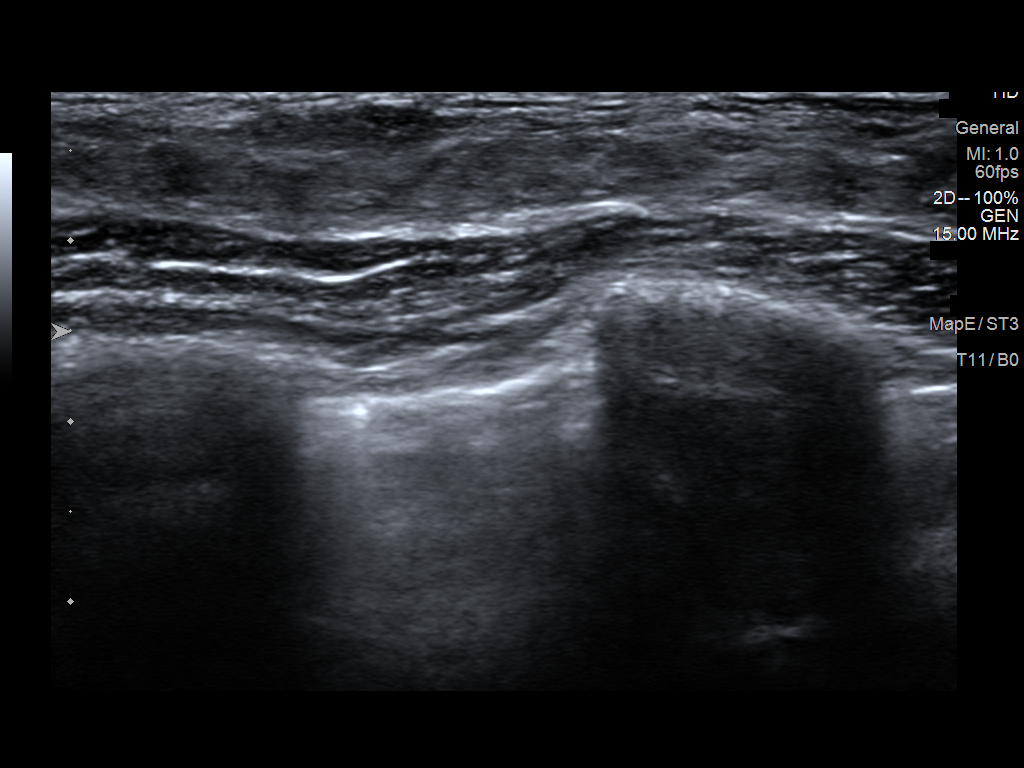
[im 2/4]
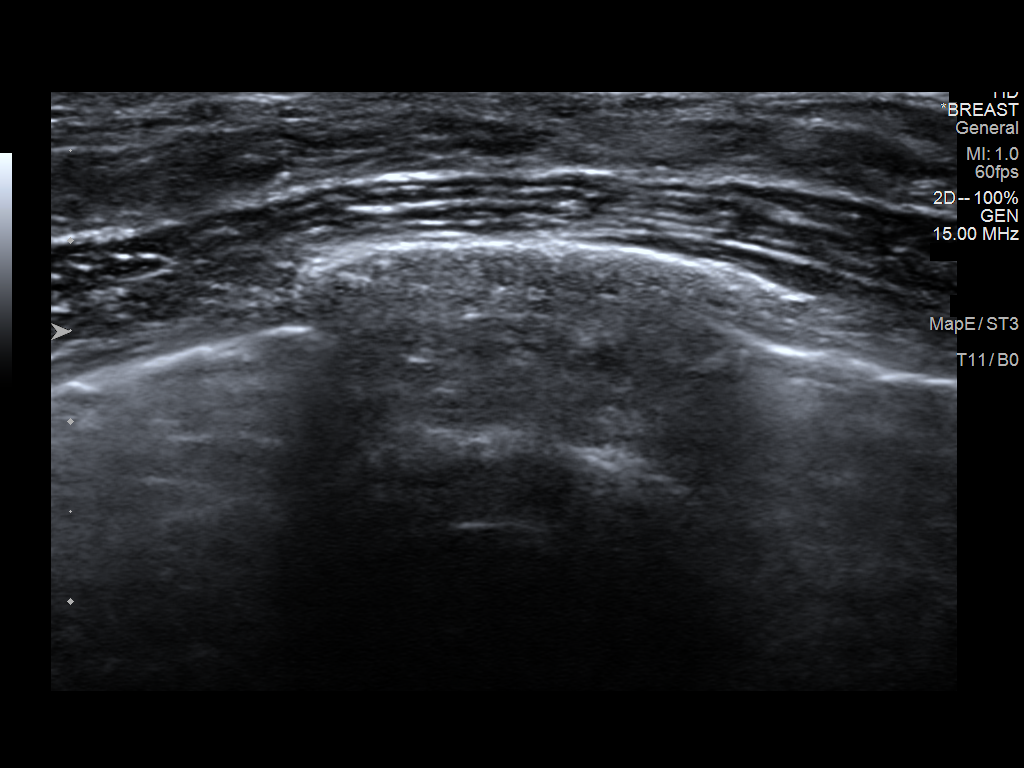
[im 3/4]
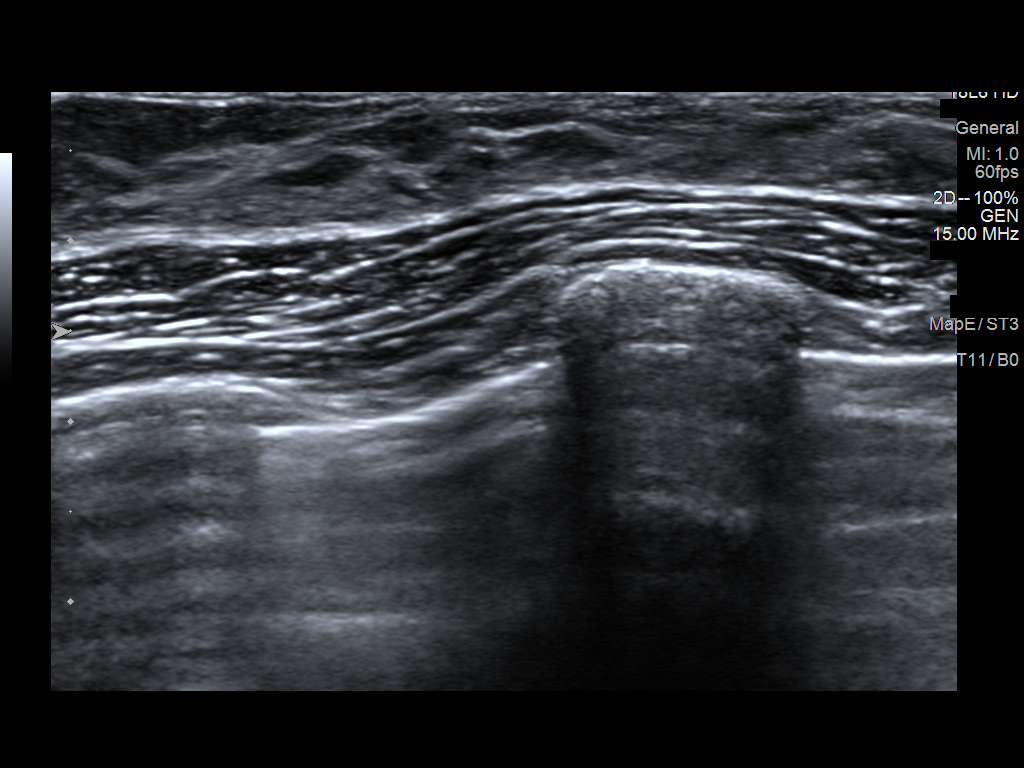
[im 4/4]
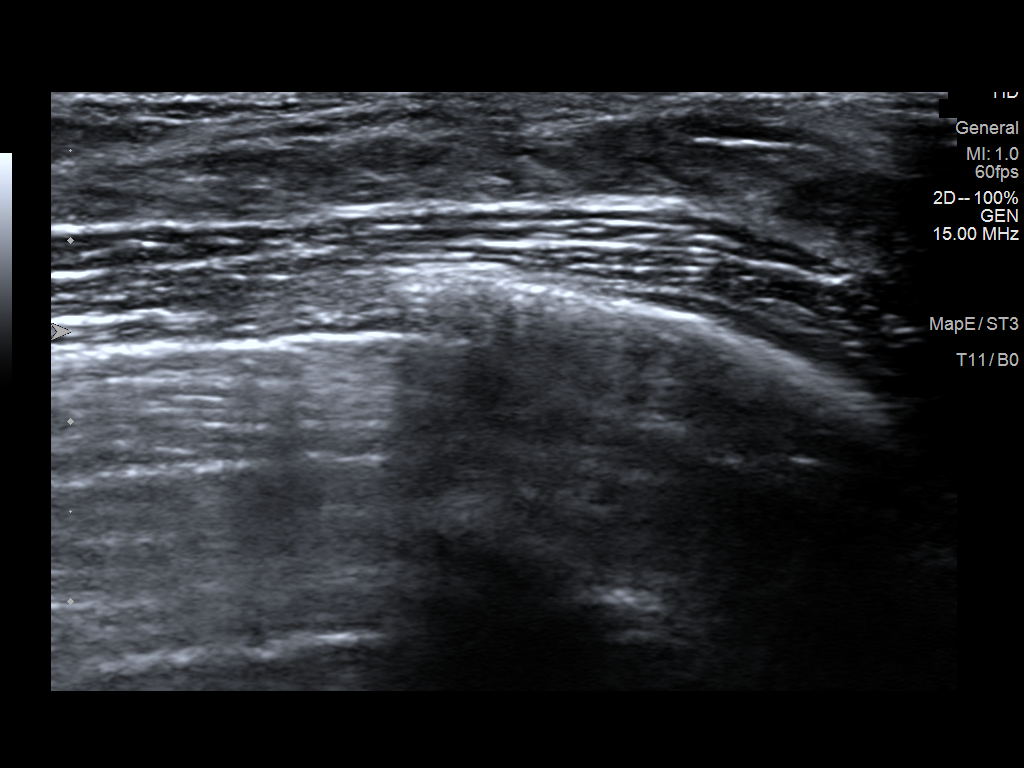

[4 of 4 positions shown; findings below may reference images not displayed]

ACR Breast Density Category d: The breast tissue is extremely dense,
which lowers the sensitivity of mammography.
FINDINGS: No mass, architectural distortion, or suspicious microcalcification
is identified to suggest malignancy in either breast.
Atherosclerotic vascular calcifications are seen in the right
breast.

Mammographic images were processed with CAD.

On physical exam, no mass is palpated in the retroareolar breast
bilaterally. No nipple discharge is expressed ON physical exam and
none occurred with acquisition of mammogram images.

Targeted ultrasound is performed, showing normal dense breast
parenchyma in the retroareolar regions bilaterally. No dilated ducts
or suspicious mass is seen in either breast.
IMPRESSION: No evidence of malignancy in either breast.

Atherosclerotic vascular calcifications.

RECOMMENDATION:
Screening mammogram in one year.(Code:EE-F-CBH)

I have discussed the findings and recommendations with the patient.
Results were also provided in writing at the conclusion of the
visit. If applicable, a reminder letter will be sent to the patient
regarding the next appointment.

BI-RADS CATEGORY  2: Benign.

## 2023-06-07 ENCOUNTER — Ambulatory Visit: Admission: EM | Admit: 2023-06-07 | Discharge: 2023-06-07 | Disposition: A | Discharge status: Home / Self Care

## 2023-06-07 DIAGNOSIS — R21 Rash and other nonspecific skin eruption: Secondary | ICD-10-CM

## 2023-06-07 DIAGNOSIS — W57XXXA Bitten or stung by nonvenomous insect and other nonvenomous arthropods, initial encounter: Secondary | ICD-10-CM

## 2023-06-07 DIAGNOSIS — S0006XA Insect bite (nonvenomous) of scalp, initial encounter: Secondary | ICD-10-CM

## 2023-06-07 MED ORDER — DOXYCYCLINE HYCLATE 100 MG PO CAPS: 100.0000 mg | ORAL_CAPSULE | Freq: Two times a day (BID) | ORAL | 0 refills | Status: AC

## 2023-06-07 NOTE — ED Provider Notes (Signed)
 [facility]    CSN: [id number] Arrival date & time: 06/07/23  1320      History   Chief Complaint Chief Complaint  Patient presents with   Insect Bite   Sore Throat    HPI Sheila Bell is a 48 y.o. female.   Patient here today for evaluation of tick bites she had a few weeks ago.  She reports that she had one tick attached to her scalp and one to her back.  She states over the last several days she has developed a rash over her body with sore throat, joint pain and abdominal pain.  She reports some fever at night with chills.  She has not had any vomiting or diarrhea.  The history is provided by the patient.  Sore Throat Pertinent negatives include no abdominal pain and no shortness of breath.    Past Medical History:  Diagnosis Date   Anemia    Blood dyscrasia    bleeds easy   DUB (dysfunctional uterine bleeding)    polyp   GERD (gastroesophageal reflux disease)    History of blood transfusion 07/2017   [facility]   History of kidney stones    in [place]   History of stomach ulcers    SVD (spontaneous vaginal delivery)    x 2   Wears dentures    upper and lower    Patient Active Problem List   Diagnosis Date Noted   RUQ pain 07/22/2017   Symptomatic anemia 07/21/2017   Menorrhagia 07/21/2017   Hypokalemia 07/21/2017   Hyponatremia 07/21/2017   Renal insufficiency 07/21/2017   Orthostasis 07/21/2017   Protein calorie malnutrition (HCC) 07/21/2017    Past Surgical History:  Procedure Laterality Date   HYSTEROSCOPY WITH D & C N/A 09/23/2017   Procedure: DILATATION AND CURETTAGE /HYSTEROSCOPY;  Surgeon: Tresia Fruit, MD;  Location: [facility];  Service: Gynecology;  Laterality: N/A;   TUBAL LIGATION      OB History     Gravida  2   Para  2   Term  2   Preterm  0   AB  0   Living  2      SAB  0   IAB  0   Ectopic  0   Multiple  0   Live Births  2            Home Medications    Prior to Admission medications    Medication Sig Start Date End Date Taking? Authorizing Provider  amoxicillin (AMOXIL) 250 MG capsule Take 250 mg by mouth 3 (three) times daily. 500mg  taken last.   Yes [provider]  doxycycline (VIBRAMYCIN) 100 MG capsule Take 1 capsule (100 mg total) by mouth 2 (two) times daily for 7 days. 06/07/23 06/14/23 Yes Vernestine Gondola, PA-C  ferrous sulfate  325 (65 FE) MG EC tablet Take 1 tablet (325 mg total) by mouth 2 (two) times daily. 10/18/17 10/18/18  Fleming, Zelda W, NP  omeprazole  (PRILOSEC) 20 MG capsule Take 1 capsule (20 mg total) by mouth at bedtime. 10/18/17   Collins Dean, NP    Family History Family History  Problem Relation Age of Onset   Hypertension Father     Social History Social History   Tobacco Use   Smoking status: Every Day    Current packs/day: 1.00    Average packs/day: 1 pack/day for 18.0 years (18.0 ttl pk-yrs)    Types: Cigarettes   Smokeless tobacco: Never  Vaping Use   Vaping status: Former  Substance Use Topics   Alcohol use: No   Drug use: No     Allergies   Patient has no known allergies.   Review of Systems Review of Systems  Constitutional:  Positive for chills, fatigue and fever.  HENT:  Positive for sore throat. Negative for congestion and ear pain.   Eyes:  Negative for discharge and redness.  Respiratory:  Negative for cough, shortness of breath and wheezing.   Gastrointestinal:  Negative for abdominal pain, diarrhea, nausea and vomiting.  Skin:  Positive for rash.     Physical Exam Triage Vital Signs ED Triage Vitals  Encounter Vitals Group     BP 06/07/23 1347 (!) 87/57     Systolic BP Percentile --      Diastolic BP Percentile --      Pulse Rate 06/07/23 1347 98     Resp 06/07/23 1347 18     Temp 06/07/23 1347 97.9 F (36.6 C)     Temp Source 06/07/23 1347 Oral     SpO2 06/07/23 1347 94 %     Weight 06/07/23 1344 110 lb (49.9 kg)     Height 06/07/23 1344 5\' 4"  (1.626 m)     Head Circumference --       Peak Flow --      Pain Score 06/07/23 1340 3     Pain Loc --      Pain Education --      Exclude from Growth Chart --    No data found.  Updated Vital Signs BP (!) 87/57 (BP Location: Left Arm) Comment: "thats my normal"  Pulse 98   Temp 97.9 F (36.6 C) (Oral)   Resp 18   Ht 5\' 4"  (1.626 m)   Wt 110 lb (49.9 kg)   LMP  (LMP Unknown)   SpO2 94%   BMI 18.88 kg/m   Visual Acuity Right Eye Distance:   Left Eye Distance:   Bilateral Distance:    Right Eye Near:   Left Eye Near:    Bilateral Near:     Physical Exam Vitals and nursing note reviewed.  Constitutional:      General: She is not in acute distress.    Appearance: Normal appearance. She is not ill-appearing.  HENT:     Head: Normocephalic and atraumatic.     Nose: No congestion or rhinorrhea.     Mouth/Throat:     Mouth: Mucous membranes are moist.  Eyes:     Conjunctiva/sclera: Conjunctivae normal.  Cardiovascular:     Rate and Rhythm: Normal rate and regular rhythm.     Heart sounds: Normal heart sounds. No murmur heard. Pulmonary:     Effort: Pulmonary effort is normal. No respiratory distress.     Breath sounds: Normal breath sounds. No wheezing, rhonchi or rales.  Skin:    General: Skin is warm and dry.     Findings: Rash present.     Comments: Diffuse erythematous maculopapular rash to bilateral arms, hands, trunk, face- no apparent lesions to palms of hands  Neurological:     Mental Status: She is alert.  Psychiatric:        Mood and Affect: Mood normal.        Thought Content: Thought content normal.      UC Treatments / Results  Labs (all labs ordered are listed, but only abnormal results are displayed) Labs Reviewed - No data to display  EKG  Radiology No results found.  Procedures Procedures (including critical care time)  Medications Ordered in UC Medications - No data to display  Initial Impression / Assessment and Plan / UC Course  I have reviewed the triage vital signs  and the nursing notes.  Pertinent labs & imaging results that were available during my care of the patient were reviewed by me and considered in my medical decision making (see chart for details).    Given diffuse rash some concern for [facility] spotted fever although odd that it spares her palms.  Will treat with doxycycline regardless given tick bites.  Advise follow-up with primary care and appointment made in office today.  Encouraged sooner follow-up with any further concerns.  Final Clinical Impressions(s) / UC Diagnoses   Final diagnoses:  Tick bite of scalp, initial encounter  Rash and nonspecific skin eruption   Discharge Instructions   None    ED Prescriptions     Medication Sig Dispense Auth. Provider   doxycycline (VIBRAMYCIN) 100 MG capsule Take 1 capsule (100 mg total) by mouth 2 (two) times daily for 7 days. 14 capsule Vernestine Gondola, PA-C      PDMP not reviewed this encounter.   Vernestine Gondola, PA-C 06/07/23 [phone]

## 2023-06-07 NOTE — ED Triage Notes (Signed)
"  I have a few tick bites 3 wks ago (they had the white spots on their back), I am noticing spots on my body, sore throat, joint pain, abd pain". Fever at night with chills (degree unknown).

## 2023-07-26 ENCOUNTER — Ambulatory Visit

## 2023-07-26 VITALS — BP 105/69 | HR 100 | Temp 97.4°F | Ht 64.0 in | Wt 105.1 lb

## 2023-07-26 DIAGNOSIS — Z1329 Encounter for screening for other suspected endocrine disorder: Secondary | ICD-10-CM | POA: Diagnosis not present

## 2023-07-26 DIAGNOSIS — Z1159 Encounter for screening for other viral diseases: Secondary | ICD-10-CM | POA: Diagnosis not present

## 2023-07-26 DIAGNOSIS — D508 Other iron deficiency anemias: Secondary | ICD-10-CM

## 2023-07-26 DIAGNOSIS — Z0000 Encounter for general adult medical examination without abnormal findings: Secondary | ICD-10-CM | POA: Diagnosis not present

## 2023-07-26 DIAGNOSIS — Z1212 Encounter for screening for malignant neoplasm of rectum: Secondary | ICD-10-CM | POA: Diagnosis not present

## 2023-07-26 DIAGNOSIS — Z1211 Encounter for screening for malignant neoplasm of colon: Secondary | ICD-10-CM

## 2023-07-26 DIAGNOSIS — F17200 Nicotine dependence, unspecified, uncomplicated: Secondary | ICD-10-CM | POA: Insufficient documentation

## 2023-07-26 DIAGNOSIS — D649 Anemia, unspecified: Secondary | ICD-10-CM

## 2023-07-26 DIAGNOSIS — Z1231 Encounter for screening mammogram for malignant neoplasm of breast: Secondary | ICD-10-CM

## 2023-07-26 DIAGNOSIS — Z1321 Encounter for screening for nutritional disorder: Secondary | ICD-10-CM

## 2023-07-26 DIAGNOSIS — K219 Gastro-esophageal reflux disease without esophagitis: Secondary | ICD-10-CM | POA: Insufficient documentation

## 2023-07-26 DIAGNOSIS — Z130 Encounter for screening for diseases of the blood and blood-forming organs and certain disorders involving the immune mechanism: Secondary | ICD-10-CM | POA: Diagnosis not present

## 2023-07-26 DIAGNOSIS — Z13228 Encounter for screening for other metabolic disorders: Secondary | ICD-10-CM

## 2023-07-26 MED ORDER — FERROUS SULFATE 325 (65 FE) MG PO TBEC: 325.0000 mg | DELAYED_RELEASE_TABLET | Freq: Every day | ORAL | 11 refills | Status: AC

## 2023-07-26 MED ORDER — OMEPRAZOLE 20 MG PO CPDR: 20.0000 mg | DELAYED_RELEASE_CAPSULE | Freq: Every day | ORAL | 2 refills | Status: AC

## 2023-07-26 NOTE — Assessment & Plan Note (Signed)
 Diagnosed 2019. Rechecking CBC with labs. Currently taking 325 mg ferrous sulfate  daily. If CBC normal, can plan to reduce iron supplement to every other day to avoid GI upset/constipation.

## 2023-07-26 NOTE — Assessment & Plan Note (Signed)
 Currently smoking 1 ppd. 35 pack-year history. Discussed options for quitting including medication assisted therapy and nicotine replacement. Patient verbalized that she is not ready to quit yet. Will plan for lung cancer screenings starting next year. Will revisit cessation discussion at next appointment.

## 2023-07-26 NOTE — Patient Instructions (Addendum)
 It was nice to see you today!  As we discussed in clinic:  -I have sent in refills on your Iron supplement and your omeprazole  to [place] [facility]  -We will get fasting blood work from you either this week or next week. I will send you a MyChart message with the results and with further recommendations if we need to do anything -Please let me know when you feel ready to quit smoking. I am here to help! -I have also placed the order for Cologuard colon cancer screening.   I will plan to see you back in 1 year for your next physical. It was nice to meet you!  If you have any problems before your next visit feel free to message me via MyChart (minor issues or questions) or call the office, otherwise you may reach out to schedule an office visit.  Thank you! Saddie Sacks, PA-C

## 2023-07-26 NOTE — Progress Notes (Signed)
 New Patient Office Visit  Subjective    Patient ID: Sheila Bell, female    DOB: [birth year]  Age: 48 y.o. MRN: [id number]  CC:  Chief Complaint  Patient presents with   New Patient (Initial Visit)    Establishing Care    HPI Sheila Bell presents to establish care. Was previously seeing Haze Servant, NP, but reports it has been several years since she has had a physical.  PMH: Thoracic scoliosis, RMSF treated with doxycyline 1 month ago (resolved), GERD on omeprazole  daily, iron deficiency anemia on iron supplement  Tobacco use: 1 ppd x 35 years Alcohol use: Denies use Drug use: Denies use Marital status: Divorced, lives at home with her brother Employment: Currently unemployed but is planning to start work again in [employer]   Screenings:  Colon Cancer: Cologuard ordered today Lung Cancer: Will start at 49 Breast Cancer: Ordered today Diabetes: Will check A1c with labs HLD: Will check lipid panel with labs   Outpatient Encounter Medications as of 07/26/2023  Medication Sig   [DISCONTINUED] amoxicillin (AMOXIL) 250 MG capsule Take 250 mg by mouth 3 (three) times daily. 500mg  taken last.   [DISCONTINUED] omeprazole  (PRILOSEC) 20 MG capsule Take 1 capsule (20 mg total) by mouth at bedtime.   ferrous sulfate  325 (65 FE) MG EC tablet Take 1 tablet (325 mg total) by mouth daily with breakfast.   omeprazole  (PRILOSEC) 20 MG capsule Take 1 capsule (20 mg total) by mouth at bedtime.   [DISCONTINUED] ferrous sulfate  325 (65 FE) MG EC tablet Take 1 tablet (325 mg total) by mouth 2 (two) times daily.   No facility-administered encounter medications on file as of 07/26/2023.    Past Medical History:  Diagnosis Date   Anemia    Blood dyscrasia    bleeds easy   DUB (dysfunctional uterine bleeding)    polyp   GERD (gastroesophageal reflux disease)    History of blood transfusion 07/2017   [facility]   History of kidney stones    in [id number]   History of  stomach ulcers    SVD (spontaneous vaginal delivery)    x 2   Wears dentures    upper and lower    Past Surgical History:  Procedure Laterality Date   HYSTEROSCOPY WITH D & C N/A 09/23/2017   Procedure: DILATATION AND CURETTAGE /HYSTEROSCOPY;  Surgeon: Eveline Lynwood MATSU, MD;  Location: [facility];  Service: Gynecology;  Laterality: N/A;   TUBAL LIGATION      Family History  Problem Relation Age of Onset   Hypertension Father     Social History   Socioeconomic History   Marital status: Divorced    Spouse name: Not on file   Number of children: 2   Years of education: Not on file   Highest education level: GED or equivalent  Occupational History   Not on file  Tobacco Use   Smoking status: Every Day    Current packs/day: 1.00    Average packs/day: 1 pack/day for 18.0 years (18.0 ttl pk-yrs)    Types: Cigarettes   Smokeless tobacco: Never  Vaping Use   Vaping status: Former  Substance and Sexual Activity   Alcohol use: No   Drug use: No   Sexual activity: Yes    Birth control/protection: Surgical, Condom  Other Topics Concern   Not on file  Social History Narrative   Not on file   Social Drivers of Health   Financial Resource Strain: Not on file  Food Insecurity: Not on file  Transportation Needs: No Transportation Needs (01/25/2018)   PRAPARE - Administrator, Civil Service (Medical): No    Lack of Transportation (Non-Medical): No  Physical Activity: Not on file  Stress: Not on file  Social Connections: Not on file  Intimate Partner Violence: Not on file    ROS   Per HPI   Objective    BP 105/69   Pulse 100   Temp (!) 97.4 F (36.3 C) (Oral)   Ht 5' 4 (1.626 m)   Wt 105 lb 1.9 oz (47.7 kg)   LMP  (LMP Unknown)   SpO2 98%   BMI 18.04 kg/m   Physical Exam Constitutional:      General: She is not in acute distress.    Appearance: Normal appearance.  HENT:     Right Ear: Tympanic membrane normal.     Left Ear: Tympanic membrane  normal.     Mouth/Throat:     Mouth: Mucous membranes are moist.     Pharynx: Oropharynx is clear.  Eyes:     Pupils: Pupils are equal, round, and reactive to light.  Cardiovascular:     Rate and Rhythm: Normal rate and regular rhythm.     Heart sounds: Normal heart sounds. No murmur heard.    No friction rub. No gallop.  Pulmonary:     Effort: Pulmonary effort is normal. No respiratory distress.     Breath sounds: Normal breath sounds.  Abdominal:     General: Abdomen is flat. Bowel sounds are normal.     Palpations: Abdomen is soft.  Musculoskeletal:        General: No swelling.     Cervical back: Normal range of motion.  Lymphadenopathy:     Cervical: No cervical adenopathy.  Skin:    General: Skin is warm and dry.  Neurological:     General: No focal deficit present.     Mental Status: She is alert.  Psychiatric:        Mood and Affect: Mood normal.        Behavior: Behavior normal.        Thought Content: Thought content normal.       Assessment & Plan:   Other iron deficiency anemia -     Ferrous Sulfate ; Take 1 tablet (325 mg total) by mouth daily with breakfast.  Dispense: 30 tablet; Refill: 11 -     CBC with Differential/Platelet; Future  Gastroesophageal reflux disease, unspecified whether esophagitis present Assessment & Plan: Well controlled with omeprazole  20 mg daily  Orders: -     Omeprazole ; Take 1 capsule (20 mg total) by mouth at bedtime.  Dispense: 90 capsule; Refill: 2  Encounter for colorectal cancer screening using Cologuard test -     Cologuard  Screening mammogram for breast cancer -     3D Screening Mammogram, Left and Right; Future  Symptomatic anemia Assessment & Plan: Diagnosed 2019. Rechecking CBC with labs. Currently taking 325 mg ferrous sulfate  daily. If CBC normal, can plan to reduce iron supplement to every other day to avoid GI upset/constipation.  Orders: -     CBC with Differential/Platelet; Future  General medical  exam Assessment & Plan: Labs ordered today. Answered all patient questions. Went over and encouraged/ordered age-appropriate health screenings to include mammogram, cologuard screening, papsmear. Patient is up-to-date on all other screenings. Declined vaccines today. Encouraged patient to aim for 150+ minutes of physical activity per week or just increase their  physical activity in general in combination with a well balanced diet that prioritizes protein and fiber to support a healthy lifestyle. Will plan for next physical in 1 year, sooner PRN. Patient verbalized understanding and was in agreement with the plan.    Tobacco use disorder Assessment & Plan: Currently smoking 1 ppd. 35 pack-year history. Discussed options for quitting including medication assisted therapy and nicotine replacement. Patient verbalized that she is not ready to quit yet. Will plan for lung cancer screenings starting next year. Will revisit cessation discussion at next appointment.   Screening for endocrine, nutritional, metabolic and immunity disorder -     VITAMIN D 25 Hydroxy (Vit-D Deficiency, Fractures); Future -     Hemoglobin A1c; Future -     Comprehensive metabolic panel with GFR; Future -     Lipid panel; Future -     TSH; Future  Screening for viral disease -     HCV RNA quant rflx ultra or genotyp; Future    Return in about 1 year (around 07/25/2024) for Physical.   Saddie JULIANNA Sacks, PA-C

## 2023-07-26 NOTE — Assessment & Plan Note (Signed)
 Labs ordered today. Answered all patient questions. Went over and encouraged/ordered age-appropriate health screenings to include mammogram, cologuard screening, papsmear. Patient is up-to-date on all other screenings. Declined vaccines today. Encouraged patient to aim for 150+ minutes of physical activity per week or just increase their physical activity in general in combination with a well balanced diet that prioritizes protein and fiber to support a healthy lifestyle. Will plan for next physical in 1 year, sooner PRN. Patient verbalized understanding and was in agreement with the plan.

## 2023-07-26 NOTE — Assessment & Plan Note (Signed)
Well controlled with omeprazole 20 mg daily

## 2023-07-28 ENCOUNTER — Other Ambulatory Visit

## 2023-07-28 DIAGNOSIS — Z130 Encounter for screening for diseases of the blood and blood-forming organs and certain disorders involving the immune mechanism: Secondary | ICD-10-CM

## 2023-07-28 DIAGNOSIS — D508 Other iron deficiency anemias: Secondary | ICD-10-CM

## 2023-07-28 DIAGNOSIS — D649 Anemia, unspecified: Secondary | ICD-10-CM

## 2023-07-28 DIAGNOSIS — Z1159 Encounter for screening for other viral diseases: Secondary | ICD-10-CM

## 2023-08-18 LAB — COLOGUARD

## 2023-08-19 ENCOUNTER — Telehealth: Payer: Self-pay

## 2023-08-19 NOTE — Telephone Encounter (Signed)
 LVM to return call to reschedule her lab appt

## 2023-08-25 DIAGNOSIS — Z1212 Encounter for screening for malignant neoplasm of rectum: Secondary | ICD-10-CM | POA: Diagnosis not present

## 2023-08-25 DIAGNOSIS — Z1211 Encounter for screening for malignant neoplasm of colon: Secondary | ICD-10-CM | POA: Diagnosis not present

## 2023-09-02 LAB — COLOGUARD: COLOGUARD: POSITIVE — AB

## 2023-09-03 ENCOUNTER — Ambulatory Visit: Payer: Self-pay

## 2023-09-03 DIAGNOSIS — R195 Other fecal abnormalities: Secondary | ICD-10-CM

## 2023-09-03 DIAGNOSIS — Z1211 Encounter for screening for malignant neoplasm of colon: Secondary | ICD-10-CM

## 2024-07-26 ENCOUNTER — Encounter
